# Patient Record
Sex: Male | Born: 1960
Health system: Southern US, Community
[De-identification: ages and names within clinical notes are randomized; demographics above are authoritative.]

## PROBLEM LIST (undated history)

## (undated) DIAGNOSIS — E785 Hyperlipidemia, unspecified: Secondary | ICD-10-CM

## (undated) DIAGNOSIS — J45909 Unspecified asthma, uncomplicated: Secondary | ICD-10-CM

## (undated) DIAGNOSIS — N529 Male erectile dysfunction, unspecified: Secondary | ICD-10-CM

## (undated) HISTORY — DX: Unspecified asthma, uncomplicated: J45.909

## (undated) HISTORY — DX: Hyperlipidemia, unspecified: E78.5

## (undated) HISTORY — DX: Male erectile dysfunction, unspecified: N52.9

---
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
## 2016-06-12 DIAGNOSIS — Z111 Encounter for screening for respiratory tuberculosis: Secondary | ICD-10-CM | POA: Diagnosis not present

## 2016-07-02 DIAGNOSIS — E785 Hyperlipidemia, unspecified: Secondary | ICD-10-CM | POA: Diagnosis not present

## 2016-07-02 DIAGNOSIS — Z0000 Encounter for general adult medical examination without abnormal findings: Secondary | ICD-10-CM | POA: Diagnosis not present

## 2016-07-02 DIAGNOSIS — Z125 Encounter for screening for malignant neoplasm of prostate: Secondary | ICD-10-CM | POA: Diagnosis not present

## 2016-07-02 DIAGNOSIS — Z131 Encounter for screening for diabetes mellitus: Secondary | ICD-10-CM | POA: Diagnosis not present

## 2016-11-14 DIAGNOSIS — J069 Acute upper respiratory infection, unspecified: Secondary | ICD-10-CM | POA: Diagnosis not present

## 2016-11-30 DIAGNOSIS — R05 Cough: Secondary | ICD-10-CM | POA: Diagnosis not present

## 2016-12-05 ENCOUNTER — Ambulatory Visit
Admission: RE | Admit: 2016-12-05 | Discharge: 2016-12-05 | Disposition: A | Discharge status: Home / Self Care | Payer: BLUE CROSS/BLUE SHIELD | Source: Ambulatory Visit | Attending: Family Medicine | Admitting: Family Medicine

## 2016-12-05 ENCOUNTER — Other Ambulatory Visit: Payer: Self-pay | Admitting: Family Medicine

## 2016-12-05 DIAGNOSIS — R059 Cough, unspecified: Secondary | ICD-10-CM

## 2016-12-05 DIAGNOSIS — R05 Cough: Secondary | ICD-10-CM | POA: Diagnosis not present

## 2016-12-05 IMAGING — CR DG CHEST 2V
2 series · 2 of 2 positions shown · non-contrast
Comparison: None.

CLINICAL DATA: Cough

EXAM:
CHEST  2 VIEW

[w chest pa]
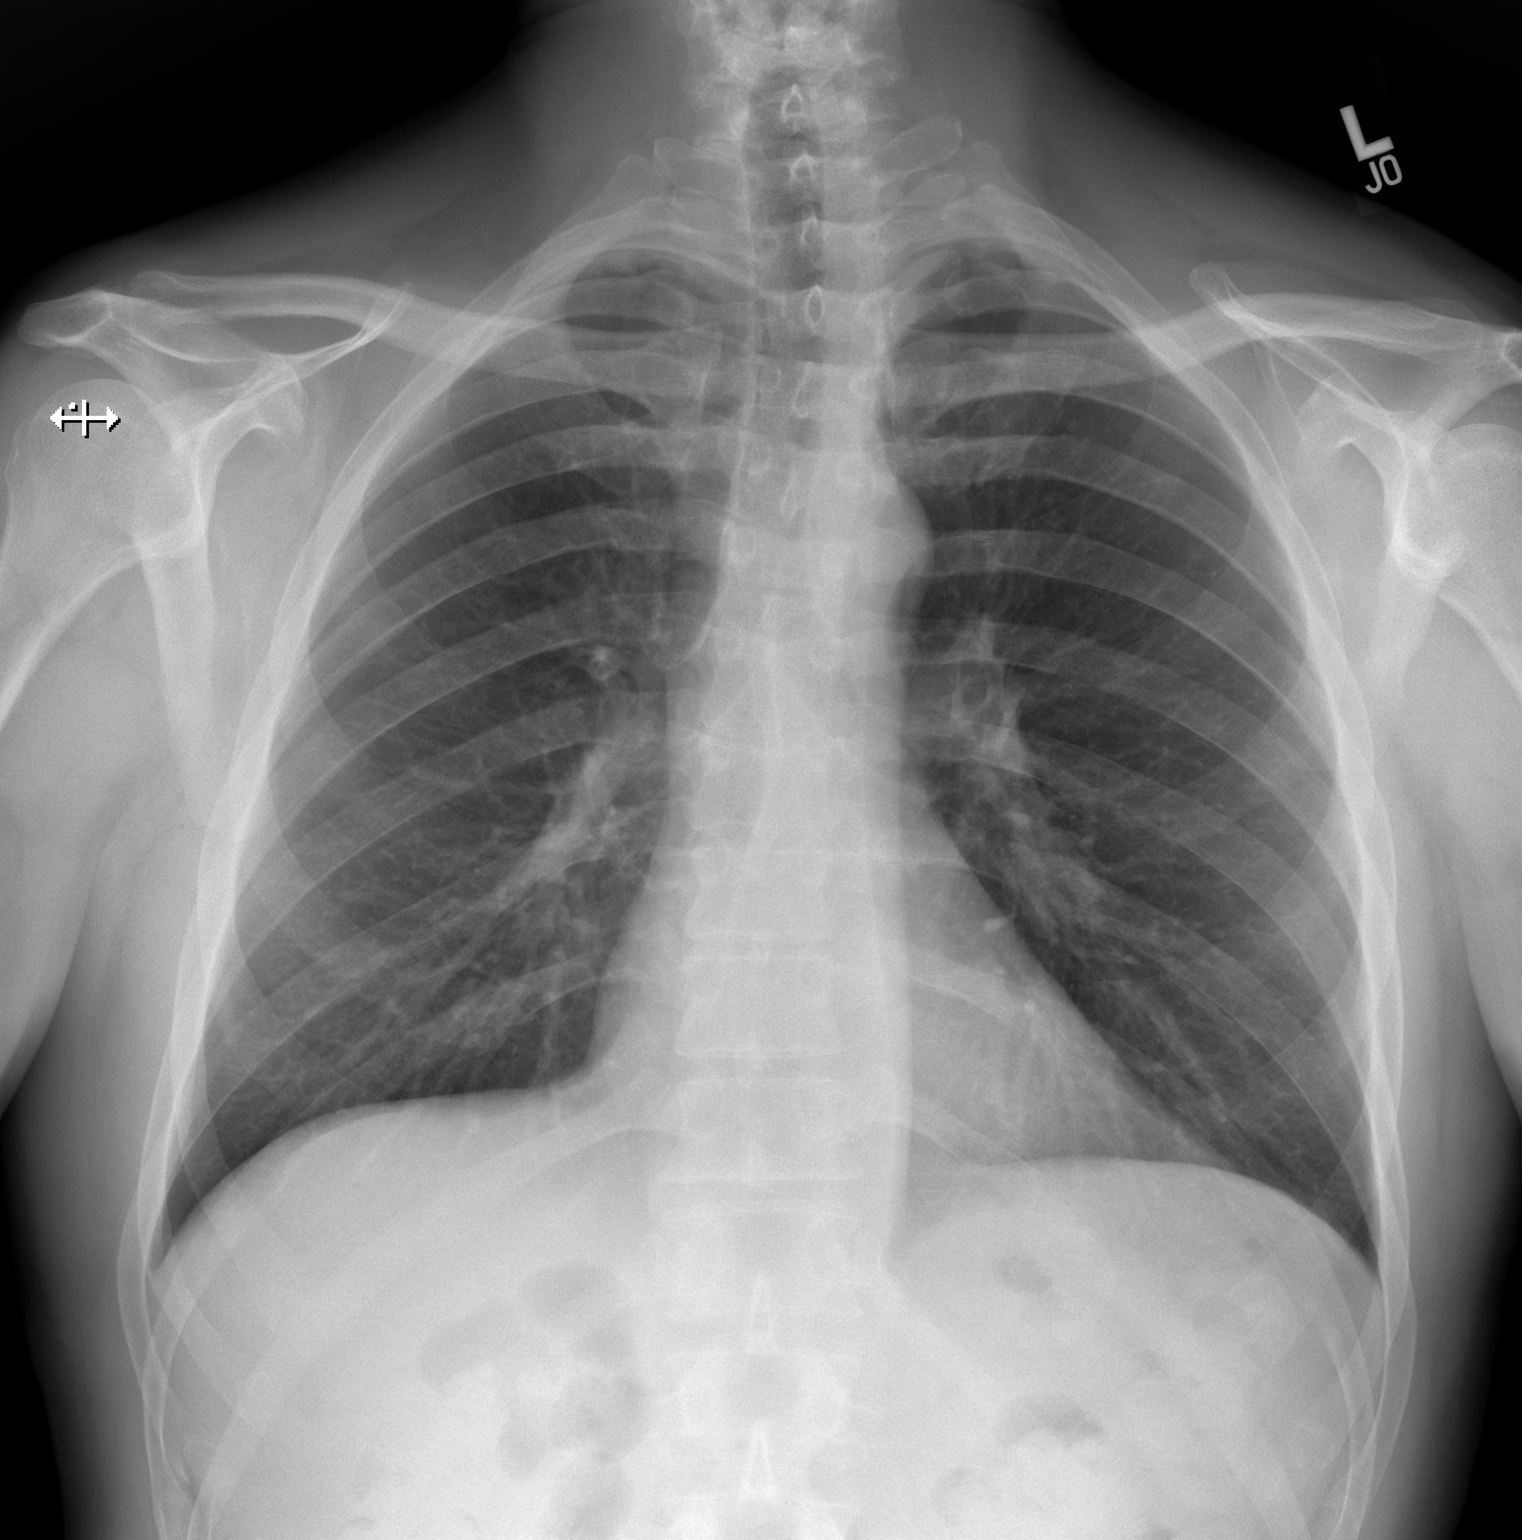

[w chest lat]
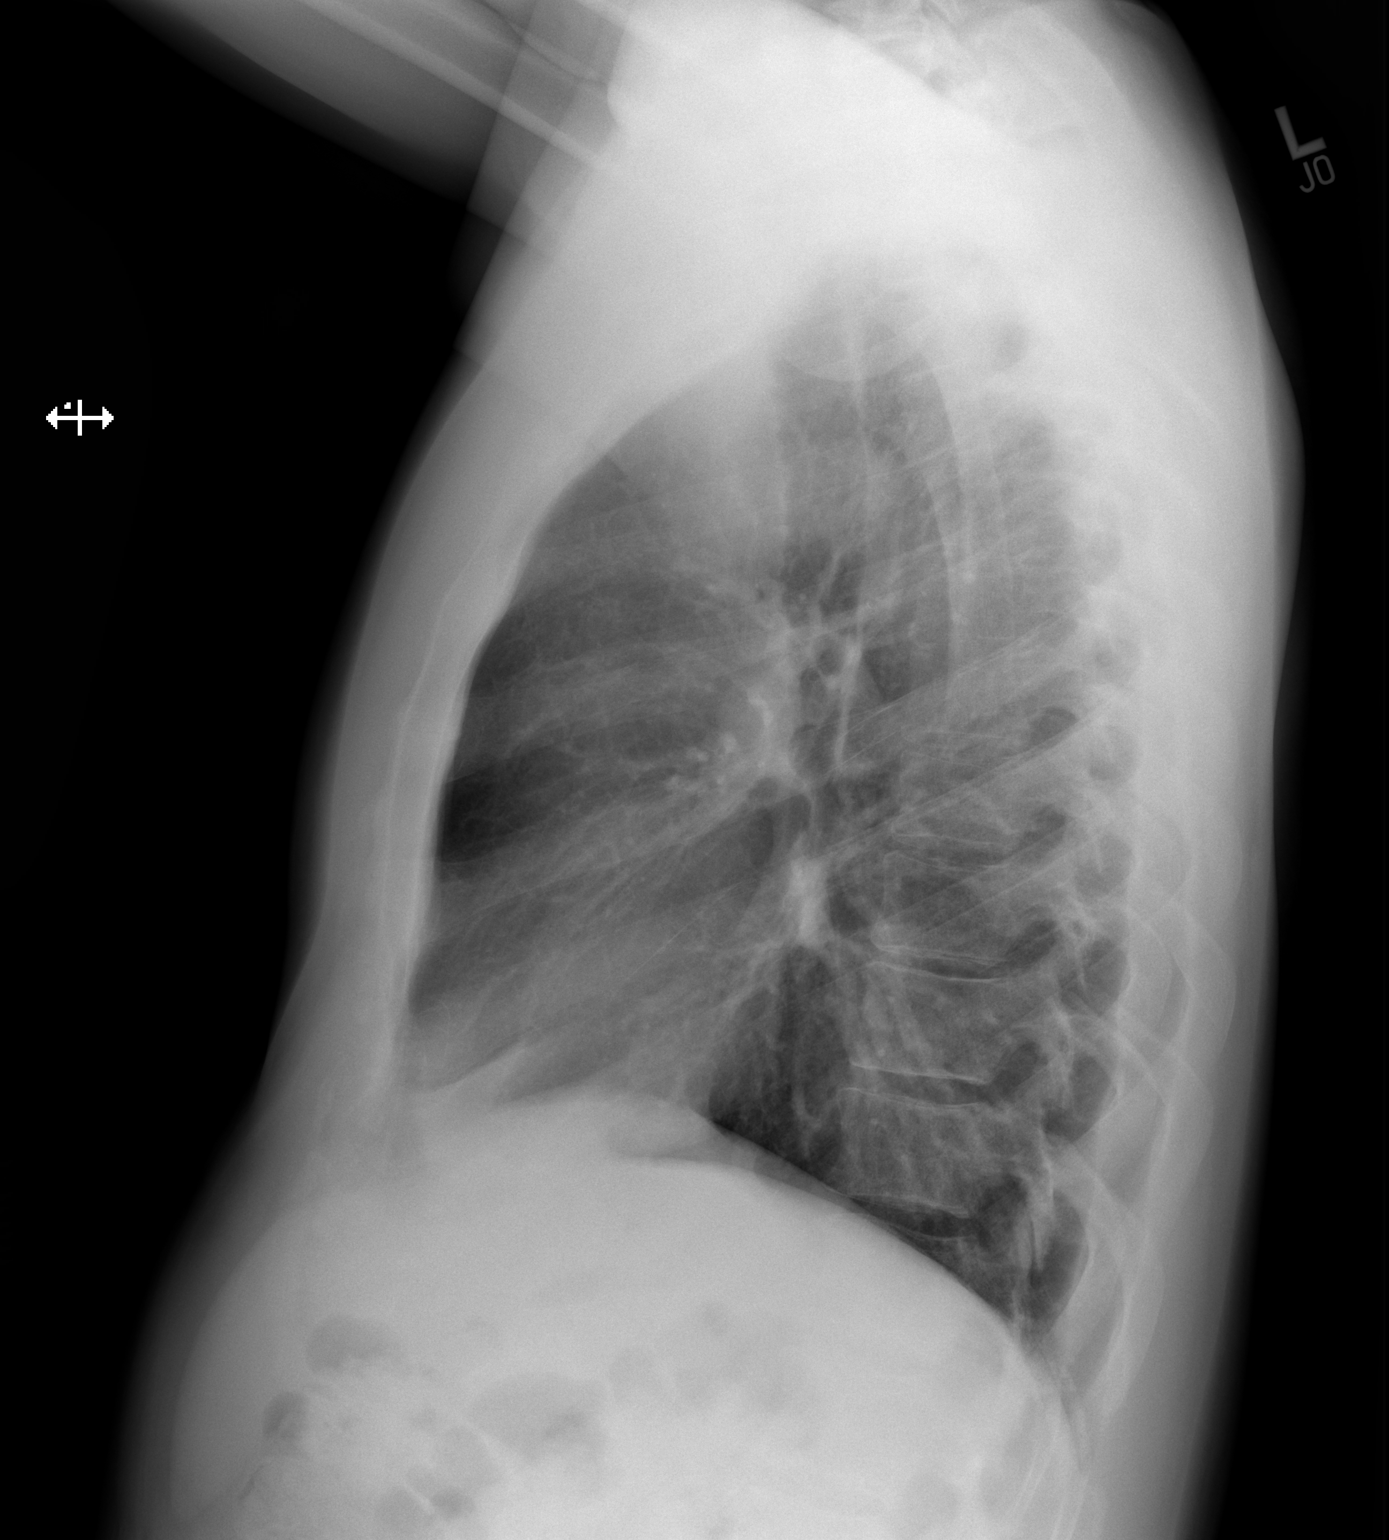

[2 of 2 positions shown; findings below may reference images not displayed]

FINDINGS: Heart and mediastinal contours are within normal limits. No focal
opacities or effusions. No acute bony abnormality.
IMPRESSION: No active cardiopulmonary disease.

## 2017-07-05 DIAGNOSIS — Z113 Encounter for screening for infections with a predominantly sexual mode of transmission: Secondary | ICD-10-CM | POA: Diagnosis not present

## 2017-07-05 DIAGNOSIS — E785 Hyperlipidemia, unspecified: Secondary | ICD-10-CM | POA: Diagnosis not present

## 2017-07-05 DIAGNOSIS — Z0000 Encounter for general adult medical examination without abnormal findings: Secondary | ICD-10-CM | POA: Diagnosis not present

## 2017-07-05 DIAGNOSIS — Z125 Encounter for screening for malignant neoplasm of prostate: Secondary | ICD-10-CM | POA: Diagnosis not present

## 2017-08-06 DIAGNOSIS — Z111 Encounter for screening for respiratory tuberculosis: Secondary | ICD-10-CM | POA: Diagnosis not present

## 2018-08-06 DIAGNOSIS — N529 Male erectile dysfunction, unspecified: Secondary | ICD-10-CM | POA: Diagnosis not present

## 2018-08-06 DIAGNOSIS — Z1211 Encounter for screening for malignant neoplasm of colon: Secondary | ICD-10-CM | POA: Diagnosis not present

## 2018-08-06 DIAGNOSIS — R51 Headache: Secondary | ICD-10-CM | POA: Diagnosis not present

## 2018-08-06 DIAGNOSIS — E785 Hyperlipidemia, unspecified: Secondary | ICD-10-CM | POA: Diagnosis not present

## 2018-08-12 ENCOUNTER — Other Ambulatory Visit: Payer: Self-pay | Admitting: Family Medicine

## 2018-08-12 DIAGNOSIS — G4484 Primary exertional headache: Secondary | ICD-10-CM

## 2018-08-18 ENCOUNTER — Other Ambulatory Visit: Payer: BLUE CROSS/BLUE SHIELD

## 2018-09-12 DIAGNOSIS — R159 Full incontinence of feces: Secondary | ICD-10-CM | POA: Diagnosis not present

## 2018-12-12 DIAGNOSIS — Z0000 Encounter for general adult medical examination without abnormal findings: Secondary | ICD-10-CM | POA: Diagnosis not present

## 2018-12-19 DIAGNOSIS — Z125 Encounter for screening for malignant neoplasm of prostate: Secondary | ICD-10-CM | POA: Diagnosis not present

## 2018-12-19 DIAGNOSIS — Z23 Encounter for immunization: Secondary | ICD-10-CM | POA: Diagnosis not present

## 2018-12-19 DIAGNOSIS — E785 Hyperlipidemia, unspecified: Secondary | ICD-10-CM | POA: Diagnosis not present

## 2019-01-02 DIAGNOSIS — Z1211 Encounter for screening for malignant neoplasm of colon: Secondary | ICD-10-CM | POA: Diagnosis not present

## 2019-01-22 DIAGNOSIS — Z20828 Contact with and (suspected) exposure to other viral communicable diseases: Secondary | ICD-10-CM | POA: Diagnosis not present

## 2019-02-02 DIAGNOSIS — F4322 Adjustment disorder with anxiety: Secondary | ICD-10-CM | POA: Diagnosis not present

## 2019-02-16 DIAGNOSIS — F4322 Adjustment disorder with anxiety: Secondary | ICD-10-CM | POA: Diagnosis not present

## 2019-03-02 DIAGNOSIS — F4322 Adjustment disorder with anxiety: Secondary | ICD-10-CM | POA: Diagnosis not present

## 2019-03-11 DIAGNOSIS — Z20828 Contact with and (suspected) exposure to other viral communicable diseases: Secondary | ICD-10-CM | POA: Diagnosis not present

## 2019-03-16 DIAGNOSIS — F4322 Adjustment disorder with anxiety: Secondary | ICD-10-CM | POA: Diagnosis not present

## 2019-04-08 DIAGNOSIS — F4322 Adjustment disorder with anxiety: Secondary | ICD-10-CM | POA: Diagnosis not present

## 2019-04-20 DIAGNOSIS — F4322 Adjustment disorder with anxiety: Secondary | ICD-10-CM | POA: Diagnosis not present

## 2019-05-04 DIAGNOSIS — F4322 Adjustment disorder with anxiety: Secondary | ICD-10-CM | POA: Diagnosis not present

## 2019-05-07 DIAGNOSIS — Z20828 Contact with and (suspected) exposure to other viral communicable diseases: Secondary | ICD-10-CM | POA: Diagnosis not present

## 2019-05-20 DIAGNOSIS — Z20828 Contact with and (suspected) exposure to other viral communicable diseases: Secondary | ICD-10-CM | POA: Diagnosis not present

## 2019-06-12 ENCOUNTER — Ambulatory Visit: Payer: BLUE CROSS/BLUE SHIELD | Attending: Internal Medicine

## 2019-06-19 ENCOUNTER — Ambulatory Visit: Payer: BLUE CROSS/BLUE SHIELD | Attending: Internal Medicine

## 2019-06-19 ENCOUNTER — Other Ambulatory Visit: Payer: Self-pay

## 2019-06-19 DIAGNOSIS — Z23 Encounter for immunization: Secondary | ICD-10-CM | POA: Insufficient documentation

## 2019-06-19 NOTE — Progress Notes (Signed)
   Covid-19 Vaccination Clinic  Name:  Olden Klauer    MRN: [id number] DOB: 1960/05/03  06/19/2019  Mr. Corp was observed post Covid-19 immunization for 15 minutes without incident. He was provided with Vaccine Information Sheet and instruction to access the V-Safe system.   Mr. Koenigs was instructed to call 911 with any severe reactions post vaccine: Marland Kitchen Difficulty breathing  . Swelling of face and throat  . A fast heartbeat  . A bad rash all over body  . Dizziness and weakness   Immunizations Administered    Name Date Dose VIS Date Route   Pfizer COVID-19 Vaccine 06/19/2019  3:29 PM 0.3 mL 03/27/2019 Intramuscular   Manufacturer: [employer], [employer]   Lot: [id number]   NDC: [address]-2

## 2019-07-20 ENCOUNTER — Ambulatory Visit: Payer: Self-pay

## 2019-07-22 ENCOUNTER — Ambulatory Visit: Payer: Self-pay | Attending: Internal Medicine

## 2019-07-22 DIAGNOSIS — Z23 Encounter for immunization: Secondary | ICD-10-CM

## 2019-07-22 NOTE — Progress Notes (Signed)
   Covid-19 Vaccination Clinic  Name:  Allen Burns    MRN: [id number] DOB: 10-26-1960  07/22/2019  Allen Burns was observed post Covid-19 immunization for 15 minutes without incident. He was provided with Vaccine Information Sheet and instruction to access the V-Safe system.   Allen Burns was instructed to call 911 with any severe reactions post vaccine: Marland Kitchen Difficulty breathing  . Swelling of face and throat  . A fast heartbeat  . A bad rash all over body  . Dizziness and weakness   Immunizations Administered    Name Date Dose VIS Date Route   Pfizer COVID-19 Vaccine 07/22/2019  1:05 PM 0.3 mL 03/27/2019 Intramuscular   Manufacturer: [employer], [employer]   Lot: [id number]   NDC: [address]-1

## 2019-08-14 DIAGNOSIS — Z23 Encounter for immunization: Secondary | ICD-10-CM | POA: Diagnosis not present

## 2019-08-18 DIAGNOSIS — E785 Hyperlipidemia, unspecified: Secondary | ICD-10-CM | POA: Diagnosis not present

## 2019-08-18 DIAGNOSIS — Z0000 Encounter for general adult medical examination without abnormal findings: Secondary | ICD-10-CM | POA: Diagnosis not present

## 2019-08-18 DIAGNOSIS — Z125 Encounter for screening for malignant neoplasm of prostate: Secondary | ICD-10-CM | POA: Diagnosis not present

## 2019-08-18 DIAGNOSIS — Z13220 Encounter for screening for lipoid disorders: Secondary | ICD-10-CM | POA: Diagnosis not present

## 2019-08-28 ENCOUNTER — Ambulatory Visit: Payer: Self-pay | Admitting: Sports Medicine

## 2019-09-07 DIAGNOSIS — F4322 Adjustment disorder with anxiety: Secondary | ICD-10-CM | POA: Diagnosis not present

## 2019-09-16 DIAGNOSIS — F4322 Adjustment disorder with anxiety: Secondary | ICD-10-CM | POA: Diagnosis not present

## 2019-09-21 DIAGNOSIS — M5412 Radiculopathy, cervical region: Secondary | ICD-10-CM | POA: Diagnosis not present

## 2019-09-24 DIAGNOSIS — M5412 Radiculopathy, cervical region: Secondary | ICD-10-CM | POA: Diagnosis not present

## 2019-09-29 DIAGNOSIS — M5412 Radiculopathy, cervical region: Secondary | ICD-10-CM | POA: Diagnosis not present

## 2019-10-01 DIAGNOSIS — M5412 Radiculopathy, cervical region: Secondary | ICD-10-CM | POA: Diagnosis not present

## 2019-10-06 DIAGNOSIS — M5412 Radiculopathy, cervical region: Secondary | ICD-10-CM | POA: Diagnosis not present

## 2019-10-09 DIAGNOSIS — M5412 Radiculopathy, cervical region: Secondary | ICD-10-CM | POA: Diagnosis not present

## 2019-10-14 DIAGNOSIS — F4322 Adjustment disorder with anxiety: Secondary | ICD-10-CM | POA: Diagnosis not present

## 2019-10-16 DIAGNOSIS — S86111D Strain of other muscle(s) and tendon(s) of posterior muscle group at lower leg level, right leg, subsequent encounter: Secondary | ICD-10-CM | POA: Diagnosis not present

## 2019-10-26 DIAGNOSIS — S86111D Strain of other muscle(s) and tendon(s) of posterior muscle group at lower leg level, right leg, subsequent encounter: Secondary | ICD-10-CM | POA: Diagnosis not present

## 2019-10-28 DIAGNOSIS — F4322 Adjustment disorder with anxiety: Secondary | ICD-10-CM | POA: Diagnosis not present

## 2019-10-30 DIAGNOSIS — S86111D Strain of other muscle(s) and tendon(s) of posterior muscle group at lower leg level, right leg, subsequent encounter: Secondary | ICD-10-CM | POA: Diagnosis not present

## 2019-11-16 DIAGNOSIS — S86111D Strain of other muscle(s) and tendon(s) of posterior muscle group at lower leg level, right leg, subsequent encounter: Secondary | ICD-10-CM | POA: Diagnosis not present

## 2019-11-18 DIAGNOSIS — S86111D Strain of other muscle(s) and tendon(s) of posterior muscle group at lower leg level, right leg, subsequent encounter: Secondary | ICD-10-CM | POA: Diagnosis not present

## 2019-11-23 DIAGNOSIS — S86102D Unspecified injury of other muscle(s) and tendon(s) of posterior muscle group at lower leg level, left leg, subsequent encounter: Secondary | ICD-10-CM | POA: Diagnosis not present

## 2019-11-23 DIAGNOSIS — S86111D Strain of other muscle(s) and tendon(s) of posterior muscle group at lower leg level, right leg, subsequent encounter: Secondary | ICD-10-CM | POA: Diagnosis not present

## 2019-11-25 DIAGNOSIS — S86111D Strain of other muscle(s) and tendon(s) of posterior muscle group at lower leg level, right leg, subsequent encounter: Secondary | ICD-10-CM | POA: Diagnosis not present

## 2019-11-25 DIAGNOSIS — F4322 Adjustment disorder with anxiety: Secondary | ICD-10-CM | POA: Diagnosis not present

## 2019-11-25 DIAGNOSIS — S86102D Unspecified injury of other muscle(s) and tendon(s) of posterior muscle group at lower leg level, left leg, subsequent encounter: Secondary | ICD-10-CM | POA: Diagnosis not present

## 2019-11-26 LAB — HM HEPATITIS C SCREENING LAB: HM Hepatitis Screen: NEGATIVE

## 2019-11-27 DIAGNOSIS — K635 Polyp of colon: Secondary | ICD-10-CM | POA: Diagnosis not present

## 2019-11-27 DIAGNOSIS — Z800 Family history of malignant neoplasm of digestive organs: Secondary | ICD-10-CM | POA: Diagnosis not present

## 2019-11-27 DIAGNOSIS — Z1211 Encounter for screening for malignant neoplasm of colon: Secondary | ICD-10-CM | POA: Diagnosis not present

## 2019-11-27 DIAGNOSIS — K6389 Other specified diseases of intestine: Secondary | ICD-10-CM | POA: Diagnosis not present

## 2019-11-27 LAB — HM COLONOSCOPY

## 2019-11-30 DIAGNOSIS — S86111D Strain of other muscle(s) and tendon(s) of posterior muscle group at lower leg level, right leg, subsequent encounter: Secondary | ICD-10-CM | POA: Diagnosis not present

## 2019-11-30 DIAGNOSIS — S86102D Unspecified injury of other muscle(s) and tendon(s) of posterior muscle group at lower leg level, left leg, subsequent encounter: Secondary | ICD-10-CM | POA: Diagnosis not present

## 2019-12-02 DIAGNOSIS — S86102D Unspecified injury of other muscle(s) and tendon(s) of posterior muscle group at lower leg level, left leg, subsequent encounter: Secondary | ICD-10-CM | POA: Diagnosis not present

## 2019-12-02 DIAGNOSIS — S86111D Strain of other muscle(s) and tendon(s) of posterior muscle group at lower leg level, right leg, subsequent encounter: Secondary | ICD-10-CM | POA: Diagnosis not present

## 2019-12-07 ENCOUNTER — Other Ambulatory Visit: Payer: Self-pay | Admitting: Gastroenterology

## 2019-12-07 DIAGNOSIS — S86102D Unspecified injury of other muscle(s) and tendon(s) of posterior muscle group at lower leg level, left leg, subsequent encounter: Secondary | ICD-10-CM | POA: Diagnosis not present

## 2019-12-07 DIAGNOSIS — S86111D Strain of other muscle(s) and tendon(s) of posterior muscle group at lower leg level, right leg, subsequent encounter: Secondary | ICD-10-CM | POA: Diagnosis not present

## 2019-12-07 DIAGNOSIS — Z800 Family history of malignant neoplasm of digestive organs: Secondary | ICD-10-CM

## 2019-12-09 DIAGNOSIS — F4322 Adjustment disorder with anxiety: Secondary | ICD-10-CM | POA: Diagnosis not present

## 2019-12-14 DIAGNOSIS — S86102D Unspecified injury of other muscle(s) and tendon(s) of posterior muscle group at lower leg level, left leg, subsequent encounter: Secondary | ICD-10-CM | POA: Diagnosis not present

## 2019-12-14 DIAGNOSIS — S86111D Strain of other muscle(s) and tendon(s) of posterior muscle group at lower leg level, right leg, subsequent encounter: Secondary | ICD-10-CM | POA: Diagnosis not present

## 2019-12-16 ENCOUNTER — Other Ambulatory Visit: Payer: Self-pay | Admitting: Gastroenterology

## 2019-12-18 ENCOUNTER — Ambulatory Visit
Admission: RE | Admit: 2019-12-18 | Discharge: 2019-12-18 | Disposition: A | Discharge status: Home / Self Care | Payer: BC Managed Care – PPO | Source: Ambulatory Visit | Attending: Gastroenterology | Admitting: Gastroenterology

## 2019-12-18 DIAGNOSIS — Z800 Family history of malignant neoplasm of digestive organs: Secondary | ICD-10-CM

## 2019-12-18 DIAGNOSIS — K8020 Calculus of gallbladder without cholecystitis without obstruction: Secondary | ICD-10-CM | POA: Diagnosis not present

## 2019-12-18 DIAGNOSIS — Z86018 Personal history of other benign neoplasm: Secondary | ICD-10-CM | POA: Diagnosis not present

## 2019-12-18 DIAGNOSIS — I700 Atherosclerosis of aorta: Secondary | ICD-10-CM | POA: Diagnosis not present

## 2019-12-18 MED ORDER — IOPAMIDOL (ISOVUE-300) INJECTION 61%
100.0000 mL | Freq: Once | INTRAVENOUS | Status: AC | PRN
  Administered 2019-12-18: 100 mL via INTRAVENOUS

## 2019-12-23 DIAGNOSIS — F4322 Adjustment disorder with anxiety: Secondary | ICD-10-CM | POA: Diagnosis not present

## 2019-12-23 DIAGNOSIS — S86102D Unspecified injury of other muscle(s) and tendon(s) of posterior muscle group at lower leg level, left leg, subsequent encounter: Secondary | ICD-10-CM | POA: Diagnosis not present

## 2019-12-23 DIAGNOSIS — S86111D Strain of other muscle(s) and tendon(s) of posterior muscle group at lower leg level, right leg, subsequent encounter: Secondary | ICD-10-CM | POA: Diagnosis not present

## 2020-01-06 DIAGNOSIS — F4322 Adjustment disorder with anxiety: Secondary | ICD-10-CM | POA: Diagnosis not present

## 2020-01-18 ENCOUNTER — Ambulatory Visit (INDEPENDENT_AMBULATORY_CARE_PROVIDER_SITE_OTHER): Payer: BC Managed Care – PPO | Admitting: Cardiology

## 2020-01-18 ENCOUNTER — Other Ambulatory Visit: Payer: Self-pay

## 2020-01-18 ENCOUNTER — Encounter: Payer: Self-pay | Admitting: Cardiology

## 2020-01-18 VITALS — BP 104/70 | HR 70 | Ht 69.0 in | Wt 181.0 lb

## 2020-01-18 DIAGNOSIS — Z8249 Family history of ischemic heart disease and other diseases of the circulatory system: Secondary | ICD-10-CM | POA: Diagnosis not present

## 2020-01-18 DIAGNOSIS — N529 Male erectile dysfunction, unspecified: Secondary | ICD-10-CM | POA: Diagnosis not present

## 2020-01-18 DIAGNOSIS — Z7189 Other specified counseling: Secondary | ICD-10-CM

## 2020-01-18 DIAGNOSIS — R072 Precordial pain: Secondary | ICD-10-CM

## 2020-01-18 DIAGNOSIS — Z01812 Encounter for preprocedural laboratory examination: Secondary | ICD-10-CM

## 2020-01-18 DIAGNOSIS — E785 Hyperlipidemia, unspecified: Secondary | ICD-10-CM

## 2020-01-18 LAB — BASIC METABOLIC PANEL
BUN/Creatinine Ratio: 15 (ref 9–20)
BUN: 13 mg/dL (ref 6–24)
CO2: 27 mmol/L (ref 20–29)
Calcium: 9.2 mg/dL (ref 8.7–10.2)
Chloride: 102 mmol/L (ref 96–106)
Creatinine, Ser: 0.86 mg/dL (ref 0.76–1.27)
GFR calc Af Amer: 112 mL/min/{1.73_m2} (ref >59)
GFR calc non Af Amer: 97 mL/min/{1.73_m2} (ref >59)
Glucose: 79 mg/dL (ref 65–99)
Potassium: 4.8 mmol/L (ref 3.5–5.2)
Sodium: 140 mmol/L (ref 134–144)

## 2020-01-18 MED ORDER — METOPROLOL TARTRATE 25 MG PO TABS: ORAL_TABLET | ORAL | 0 refills | Status: DC

## 2020-01-18 NOTE — Progress Notes (Signed)
Cardiology Office Note:    Date:  01/18/2020   ID:  Allen Burns, DOB 1960-12-08, MRN [id number]  PCP:  London Pepper, MD  Cardiologist:  Buford Dresser, MD  Referring MD: London Pepper, MD   CC: new patient consultation for exertional chest discomfort  History of Present Illness:    Allen Burns is a 59 y.o. male with a hx of erectile dysfunction, former tobacco use, family history of heart disease who is seen as a new consult at the request of London Pepper, MD for the evaluation and management of exertional chest discomfort.  Note from Dr. Orland Mustard Dated 08/18/19 reviewed. Noted chest tightness with running. Lipids from 08/18/19 reviewed, noted as Tchol 157, TG 80, HDL 43, LDL 99  Today: trying to get back into exercise after having an injury. Notes sharp, nagging pain in the chest intermittently.  Chest pain: -Initial onset: several years, but had been rare. Has been more consistent since the beginning of 2021 -Quality: sharp, left sided, mild. Always in the same spot. Feels short of breath at the same time, no nausea/diaphoresis -Frequency: several times a week, not always when he starts running but notices more with exerting himself (running up a hill, etc) -Duration: a few minutes -Associated symptoms: shortness of breath -Aggravating/alleviating factors: worse with exertion, better with rest -Prior cardiac history: none -Prior workup: none -Prior treatment: none -Alcohol: none in 25 years -Tobacco: former, quit >10 years ago -Comorbidities: erectile dysfunction. Had asthma as a child, then returned in his 21s.  -Exercise level: running 3-4 miles, including up steep hill -Cardiac ROS: no PND, no orthopnea, no LE edema, no syncope -Family history: father had 3V CABG in his 81s. Doesn't know much else about father's side, mother's side is without known cardiovascular issues. Lost one sibling to colon cancer, other two with no known heart issues.  Stopped eating fast food,  drinking soda 06/2019. Lost weight, but just regained on his honeymoon.   Past Medical History:  Diagnosis Date  . Asthma   . Erectile dysfunction   . Hyperlipidemia     Past Surgical History:  Procedure Laterality Date  . UMBILICAL HERNIA REPAIR  1975    Current Medications: Current Outpatient Medications on File Prior to Visit  Medication Sig  . sildenafil (VIAGRA) 50 MG tablet Take 50 mg by mouth daily as needed.   No current facility-administered medications on file prior to visit.     Allergies:   Patient has no known allergies.   Social History   Tobacco Use  . Smoking status: Former Research scientist (life sciences)  . Smokeless tobacco: Never Used  Substance Use Topics  . Alcohol use: Not Currently  . Drug use: Not on file    Family History: family history includes Breast cancer in his father; CAD in his mother; Colon cancer in his sister; Lung cancer in his father.  ROS:   Please see the history of present illness.  Additional pertinent ROS: Constitutional: Negative for chills, fever, unintentional weight loss. Has chronic sweating at night, getting better. HENT: Negative for ear pain and hearing loss.   Eyes: Negative for loss of vision and eye pain.  Respiratory: Negative for cough, sputum, wheezing.   Cardiovascular: See HPI. Gastrointestinal: Negative for abdominal pain, melena, and hematochezia.  Genitourinary: Negative for dysuria and hematuria.  Musculoskeletal: Negative for falls and myalgias.  Skin: Negative for itching and rash.  Neurological: Negative for focal weakness, focal sensory changes and loss of consciousness.  Endo/Heme/Allergies: Does not bruise/bleed easily.  EKGs/Labs/Other Studies Reviewed:    The following studies were reviewed today: No prior cardiac studies.  EKG:  EKG is personally reviewed.  The ekg ordered today demonstrates NSR at 70 bpm  Recent Labs: No results found for requested labs within last 8760 hours.  Recent Lipid Panel No  results found for: CHOL, TRIG, HDL, CHOLHDL, VLDL, LDLCALC, LDLDIRECT  Physical Exam:    VS:  BP 104/70   Pulse 70   Ht $R'5\' 9"'KJ$  (1.753 m)   Wt 181 lb (82.1 kg)   BMI 26.73 kg/m     Wt Readings from Last 3 Encounters:  01/18/20 181 lb (82.1 kg)    GEN: Well nourished, well developed in no acute distress HEENT: Normal, moist mucous membranes NECK: No JVD CARDIAC: regular rhythm, normal S1 and S2, no rubs or gallops. No murmurs. VASCULAR: Radial and DP pulses 2+ bilaterally. No carotid bruits RESPIRATORY:  Clear to auscultation without rales, wheezing or rhonchi  ABDOMEN: Soft, non-tender, non-distended MUSCULOSKELETAL:  Ambulates independently SKIN: Warm and dry, no edema NEUROLOGIC:  Alert and oriented x 3. No focal neuro deficits noted. PSYCHIATRIC:  Normal affect    ASSESSMENT:    1. Precordial pain   2. Erectile dysfunction, unspecified erectile dysfunction type   3. Family history of heart disease   4. Cardiac risk counseling   5. Counseling on health promotion and disease prevention   6. Pre-procedure lab exam   7. Hyperlipidemia, unspecified hyperlipidemia type    PLAN:    Exertional chest pain: -discussed treadmill stress, nuclear stress/lexiscan, and CT coronary angiography. Discussed pros and cons of each, including but not limited to false positive/false negative risk, radiation risk, and risk of IV contrast dye. Based on shared decision making, decision was made to pursue CT coronary angiography. -will give one time dose of metoprolol 2 hours prior to scheduled test -counseled on need to get BMET prior to test -counseled on use of sublingual nitroglycerin and its importance to a good test. Has not taken sildenafil in a month, counseled that he cannot take this prior to test.   Erectile dysfunction: can be a marker of vascular disease, see above re: counseling  Hyperlipidemia: chronic per patient Per [facility], last labs 08/18/19 showed Tchol 157, HDL 43, LDL 99, TG  80 -on no current agents.  Family history of heart disease Cardiac risk counseling and prevention recommendations: -recommend heart healthy/Mediterranean diet, with whole grains, fruits, vegetable, fish, lean meats, nuts, and olive oil. Limit salt. -recommend moderate walking, 3-5 times/week for 30-50 minutes each session. Aim for at least 150 minutes.week. Goal should be pace of 3 miles/hours, or walking 1.5 miles in 30 minutes -recommend avoidance of tobacco products. Avoid excess alcohol. -ASCVD risk score: The ASCVD Risk score Mikey Bussing DC Jr., et al., 2013) failed to calculate for the following reasons:   Cannot find a previous HDL lab   Cannot find a previous total cholesterol lab    Plan for follow up: based on results of testing. If CT unremarkable, can follow up as needed  Buford Dresser, MD, PhD [place]  [facility]    Medication Adjustments/Labs and Tests Ordered: Current medicines are reviewed at length with the patient today.  Concerns regarding medicines are outlined above.  Orders Placed This Encounter  Procedures  . CT CORONARY MORPH W/CTA COR W/SCORE W/CA W/CM &/OR WO/CM  . CT CORONARY FRACTIONAL FLOW RESERVE DATA PREP  . CT CORONARY FRACTIONAL FLOW RESERVE FLUID ANALYSIS  . Basic metabolic panel  .  EKG 12-Lead   Meds ordered this encounter  Medications  . metoprolol tartrate (LOPRESSOR) 25 MG tablet    Sig: TAKE 1 TABLET 1 HR PRIOR TO CARDIAC PROCEDURE    Dispense:  1 tablet    Refill:  0    Patient Instructions  Medication Instructions:  Your Physician recommend you continue on your current medication as directed.    *If you need a refill on your cardiac medications before your next appointment, please call your pharmacy*   Lab Work: Your physician recommends that you return for lab work today (BMP).  If you have labs (blood work) drawn today and your tests are completely normal, you will receive your results only by: Marland Kitchen MyChart Message (if  you have MyChart) OR . A paper copy in the mail If you have any lab test that is abnormal or we need to change your treatment, we will call you to review the results.   Testing/Procedures: Cardiac CT Angiography (CTA), is a special type of CT scan that uses a computer to produce multi-dimensional views of major blood vessels throughout the body. In CT angiography, a contrast material is injected through an IV to help visualize the blood vessels [facility]   Follow-Up: At [facility], you and your health needs are our priority.  As part of our continuing mission to provide you with exceptional heart care, we have created designated Provider Care Teams.  These Care Teams include your primary Cardiologist (physician) and Advanced Practice Providers (APPs -  Physician Assistants and Nurse Practitioners) who all work together to provide you with the care you need, when you need it.  We recommend signing up for the patient portal called "MyChart".  Sign up information is provided on this After Visit Summary.  MyChart is used to connect with patients for Virtual Visits (Telemedicine).  Patients are able to view lab/test results, encounter notes, upcoming appointments, etc.  Non-urgent messages can be sent to your provider as well.   To learn more about what you can do with MyChart, go to [email or web address].    Your next appointment:   Based on test results  The format for your next appointment:   In Person  Provider:   Buford Dresser, MD  Your cardiac CT will be scheduled at one of the below locations:   [facility] [address] [place], [place] [address] [phone]   If scheduled at [facility], please arrive at the [facility] main entrance of [facility] 30 minutes prior to test start time. Proceed to the [facility] Radiology Department (first floor) to check-in and test prep.  If scheduled at [facility], please arrive 15 mins early for check-in and test prep.  Please follow these instructions carefully (unless otherwise directed):  Hold all erectile dysfunction medications at least 3 days (72 hrs) prior to test.  On the Night Before the Test: . Be sure to Drink plenty of water. . Do not consume any caffeinated/decaffeinated beverages or chocolate 12 hours prior to your test. . Do not take any antihistamines 12 hours prior to your test.   On the Day of the Test: . Drink plenty of water. Do not drink any water within one hour of the test. . Do not eat any food 4 hours prior to the test. . You may take your regular medications prior to the test.  . Take metoprolol (Lopressor) 25 mg two hours prior to test.  After the Test: . Drink plenty of water. . After receiving IV contrast, you may experience a mild flushed feeling. This is normal. . On occasion, you may experience a mild rash up to 24 hours after the test. This is not dangerous. If this occurs, you can take Benadryl 25 mg and increase your fluid intake. . If you experience trouble breathing, this can be serious. If it is severe call 911 IMMEDIATELY. If it is mild, please call our office. . If you take any of these medications: Glipizide/Metformin, Avandament, Glucavance, please do not take 48 hours after completing test unless otherwise instructed.   Once we have confirmed authorization from your insurance company, we will call you to set up a date and time for your test. Based on how quickly your insurance processes prior authorizations requests, please allow up to 4 weeks to be contacted for scheduling your Cardiac CT appointment. Be advised that routine Cardiac CT appointments could be scheduled as many as 8 weeks after your provider has ordered it.  For non-scheduling related questions, please contact the cardiac imaging nurse navigator should you have any questions/concerns: Marchia Bond, Cardiac Imaging Nurse  Navigator Burley Saver, Interim Cardiac Imaging Nurse [place] [facility] Direct Office Dial: [phone]   For scheduling needs, including cancellations and rescheduling, please call Vivien Rota at [phone], option 3.       Signed, Buford Dresser, MD PhD 01/18/2020    [facility]

## 2020-01-18 NOTE — Patient Instructions (Signed)
Medication Instructions:  Your Physician recommend you continue on your current medication as directed.    *If you need a refill on your cardiac medications before your next appointment, please call your pharmacy*   Lab Work: Your physician recommends that you return for lab work today (BMP).  If you have labs (blood work) drawn today and your tests are completely normal, you will receive your results only by: Marland Kitchen MyChart Message (if you have MyChart) OR . A paper copy in the mail If you have any lab test that is abnormal or we need to change your treatment, we will call you to review the results.   Testing/Procedures: Cardiac CT Angiography (CTA), is a special type of CT scan that uses a computer to produce multi-dimensional views of major blood vessels throughout the body. In CT angiography, a contrast material is injected through an IV to help visualize the blood vessels [facility]   Follow-Up: At [facility], you and your health needs are our priority.  As part of our continuing mission to provide you with exceptional heart care, we have created designated Provider Care Teams.  These Care Teams include your primary Cardiologist (physician) and Advanced Practice Providers (APPs -  Physician Assistants and Nurse Practitioners) who all work together to provide you with the care you need, when you need it.  We recommend signing up for the patient portal called "MyChart".  Sign up information is provided on this After Visit Summary.  MyChart is used to connect with patients for Virtual Visits (Telemedicine).  Patients are able to view lab/test results, encounter notes, upcoming appointments, etc.  Non-urgent messages can be sent to your provider as well.   To learn more about what you can do with MyChart, go to [email or web address].    Your next appointment:   Based on test results  The format for your next appointment:   In Person  Provider:   Jodelle Red,  MD  Your cardiac CT will be scheduled at one of the below locations:   [facility] [address] [place], [place] [address] [phone]   If scheduled at [facility], please arrive at the [facility] main entrance of [facility] 30 minutes prior to test start time. Proceed to the [facility] Radiology Department (first floor) to check-in and test prep.  If scheduled at [facility], please arrive 15 mins early for check-in and test prep.  Please follow these instructions carefully (unless otherwise directed):  Hold all erectile dysfunction medications at least 3 days (72 hrs) prior to test.  On the Night Before the Test: . Be sure to Drink plenty of water. . Do not consume any caffeinated/decaffeinated beverages or chocolate 12 hours prior to your test. . Do not take any antihistamines 12 hours prior to your test.   On the Day of the Test: . Drink plenty of water. Do not drink any water within one hour of the test. . Do not eat any food 4 hours prior to the test. . You may take your regular medications prior to the test.  . Take metoprolol (Lopressor) 25 mg two hours prior to test.         After the Test: . Drink plenty of water. . After receiving IV contrast, you may experience a mild flushed feeling. This is normal. . On occasion, you may experience a mild rash up to 24 hours after the test. This is not dangerous. If this occurs,  you can take Benadryl 25 mg and increase your fluid intake. . If you experience trouble breathing, this can be serious. If it is severe call 911 IMMEDIATELY. If it is mild, please call our office. . If you take any of these medications: Glipizide/Metformin, Avandament, Glucavance, please do not take 48 hours after completing test unless otherwise instructed.   Once we have confirmed authorization from your insurance company, we will call you to set up a date and time for your test. Based on  how quickly your insurance processes prior authorizations requests, please allow up to 4 weeks to be contacted for scheduling your Cardiac CT appointment. Be advised that routine Cardiac CT appointments could be scheduled as many as 8 weeks after your provider has ordered it.  For non-scheduling related questions, please contact the cardiac imaging nurse navigator should you have any questions/concerns: Marchia Bond, Cardiac Imaging Nurse Navigator Burley Saver, Interim Cardiac Imaging Nurse [place] [facility] Services Direct Office Dial: [phone]   For scheduling needs, including cancellations and rescheduling, please call Vivien Rota at [phone], option 3.

## 2020-01-19 ENCOUNTER — Other Ambulatory Visit: Payer: Self-pay | Admitting: Cardiology

## 2020-01-20 ENCOUNTER — Telehealth: Payer: Self-pay | Admitting: Cardiology

## 2020-01-20 DIAGNOSIS — F4322 Adjustment disorder with anxiety: Secondary | ICD-10-CM | POA: Diagnosis not present

## 2020-01-20 NOTE — Telephone Encounter (Signed)
Pt updated with lab results and verbalized understanding.  

## 2020-01-20 NOTE — Telephone Encounter (Signed)
Patient is returning call to discuss results from lab work completed on 01/18/20. 

## 2020-01-27 DIAGNOSIS — Z20822 Contact with and (suspected) exposure to covid-19: Secondary | ICD-10-CM | POA: Diagnosis not present

## 2020-01-31 ENCOUNTER — Encounter: Payer: Self-pay | Admitting: Cardiology

## 2020-01-31 DIAGNOSIS — E785 Hyperlipidemia, unspecified: Secondary | ICD-10-CM | POA: Insufficient documentation

## 2020-01-31 DIAGNOSIS — N529 Male erectile dysfunction, unspecified: Secondary | ICD-10-CM | POA: Insufficient documentation

## 2020-02-03 DIAGNOSIS — F4322 Adjustment disorder with anxiety: Secondary | ICD-10-CM | POA: Diagnosis not present

## 2020-02-05 ENCOUNTER — Telehealth (HOSPITAL_COMMUNITY): Payer: Self-pay | Admitting: Emergency Medicine

## 2020-02-05 NOTE — Telephone Encounter (Signed)
Pt returning phone call regarding upcoming cardiac imaging study; pt verbalizes understanding of appt date/time, parking situation and where to check in, pre-test NPO status and medications ordered, and verified current allergies; name and call back number provided for further questions should they arise Rockwell Alexandria RN Navigator Cardiac Imaging [facility] [phone] office [phone] cell  Pt reminded to pick up metoprolol from pharmacy (I called the pharm to reprepare the med for pick up)  Pt also verbalized understanding to avoid use of ED medications prior to test.  [place] Dec

## 2020-02-05 NOTE — Telephone Encounter (Signed)
Attempted to call patient regarding upcoming cardiac CT appointment. °Left message on voicemail with name and callback number °Sanora Cunanan RN Navigator Cardiac Imaging °[facility] °[phone] Office °[phone] Cell ° °

## 2020-02-06 ENCOUNTER — Other Ambulatory Visit: Payer: Self-pay | Admitting: Cardiology

## 2020-02-07 ENCOUNTER — Other Ambulatory Visit: Payer: Self-pay | Admitting: Cardiology

## 2020-02-08 ENCOUNTER — Other Ambulatory Visit: Payer: Self-pay

## 2020-02-08 ENCOUNTER — Other Ambulatory Visit: Payer: Self-pay | Admitting: Cardiology

## 2020-02-08 ENCOUNTER — Ambulatory Visit (HOSPITAL_COMMUNITY)
Admission: RE | Admit: 2020-02-08 | Discharge: 2020-02-08 | Disposition: A | Discharge status: Home / Self Care | Payer: BC Managed Care – PPO | Source: Ambulatory Visit | Attending: Cardiology | Admitting: Cardiology

## 2020-02-08 DIAGNOSIS — R072 Precordial pain: Secondary | ICD-10-CM | POA: Insufficient documentation

## 2020-02-08 DIAGNOSIS — I2510 Atherosclerotic heart disease of native coronary artery without angina pectoris: Secondary | ICD-10-CM | POA: Diagnosis not present

## 2020-02-08 MED ORDER — NITROGLYCERIN 0.4 MG SL SUBL
0.8000 mg | SUBLINGUAL_TABLET | Freq: Once | SUBLINGUAL | Status: AC
  Administered 2020-02-08: 0.8 mg via SUBLINGUAL

## 2020-02-08 MED ORDER — IOHEXOL 350 MG/ML SOLN
80.0000 mL | Freq: Once | INTRAVENOUS | Status: AC | PRN
  Administered 2020-02-08: 80 mL via INTRAVENOUS

## 2020-02-08 MED ORDER — NITROGLYCERIN 0.4 MG SL SUBL
SUBLINGUAL_TABLET | SUBLINGUAL | Status: AC
  Filled 2020-02-08: qty 2

## 2020-02-08 NOTE — Progress Notes (Signed)
CT scan completed. Tolerated well. Blood pressure slightly low, pt asymptomatic, states he feels good. D/C home ambulatory, awake and alert. In no distress

## 2020-02-10 ENCOUNTER — Ambulatory Visit (HOSPITAL_COMMUNITY)
Admission: RE | Admit: 2020-02-10 | Discharge: 2020-02-10 | Disposition: A | Discharge status: Home / Self Care | Payer: BC Managed Care – PPO | Source: Ambulatory Visit | Attending: Cardiology | Admitting: Cardiology

## 2020-02-10 DIAGNOSIS — R072 Precordial pain: Secondary | ICD-10-CM

## 2020-02-11 DIAGNOSIS — I2510 Atherosclerotic heart disease of native coronary artery without angina pectoris: Secondary | ICD-10-CM | POA: Diagnosis not present

## 2020-02-22 ENCOUNTER — Other Ambulatory Visit: Payer: Self-pay

## 2020-02-22 ENCOUNTER — Emergency Department (HOSPITAL_COMMUNITY): Payer: BC Managed Care – PPO

## 2020-02-22 ENCOUNTER — Encounter (HOSPITAL_COMMUNITY): Payer: Self-pay | Admitting: Emergency Medicine

## 2020-02-22 ENCOUNTER — Observation Stay (HOSPITAL_COMMUNITY)
Admission: EM | Admit: 2020-02-22 | Discharge: 2020-02-23 | Disposition: A | Discharge status: Home / Self Care | Payer: BC Managed Care – PPO | Attending: Cardiology | Admitting: Cardiology

## 2020-02-22 DIAGNOSIS — R072 Precordial pain: Secondary | ICD-10-CM | POA: Diagnosis not present

## 2020-02-22 DIAGNOSIS — R21 Rash and other nonspecific skin eruption: Secondary | ICD-10-CM | POA: Diagnosis not present

## 2020-02-22 DIAGNOSIS — E785 Hyperlipidemia, unspecified: Secondary | ICD-10-CM | POA: Diagnosis not present

## 2020-02-22 DIAGNOSIS — Z955 Presence of coronary angioplasty implant and graft: Secondary | ICD-10-CM

## 2020-02-22 DIAGNOSIS — Z20822 Contact with and (suspected) exposure to covid-19: Secondary | ICD-10-CM | POA: Insufficient documentation

## 2020-02-22 DIAGNOSIS — R079 Chest pain, unspecified: Secondary | ICD-10-CM | POA: Diagnosis present

## 2020-02-22 DIAGNOSIS — I200 Unstable angina: Secondary | ICD-10-CM | POA: Diagnosis not present

## 2020-02-22 DIAGNOSIS — Z87891 Personal history of nicotine dependence: Secondary | ICD-10-CM | POA: Diagnosis not present

## 2020-02-22 LAB — BASIC METABOLIC PANEL
Anion gap: 10 (ref 5–15)
BUN: 15 mg/dL (ref 6–20)
CO2: 25 mmol/L (ref 22–32)
Calcium: 9.2 mg/dL (ref 8.9–10.3)
Chloride: 101 mmol/L (ref 98–111)
Creatinine, Ser: 0.97 mg/dL (ref 0.61–1.24)
GFR, Estimated: >60 mL/min (ref >60)
Glucose, Bld: 120 mg/dL — ABNORMAL HIGH (ref 70–99)
Potassium: 4 mmol/L (ref 3.5–5.1)
Sodium: 136 mmol/L (ref 135–145)

## 2020-02-22 LAB — CBC
HCT: 49.7 % (ref 39.0–52.0)
Hemoglobin: 15.9 g/dL (ref 13.0–17.0)
MCH: 29.5 pg (ref 26.0–34.0)
MCHC: 32 g/dL (ref 30.0–36.0)
MCV: 92.2 fL (ref 80.0–100.0)
Platelets: 271 10*3/uL (ref 150–400)
RBC: 5.39 MIL/uL (ref 4.22–5.81)
RDW: 12.2 % (ref 11.5–15.5)
WBC: 8.7 10*3/uL (ref 4.0–10.5)
nRBC: 0 % (ref 0.0–0.2)

## 2020-02-22 LAB — TROPONIN I (HIGH SENSITIVITY)
Troponin I (High Sensitivity): 4 ng/L (ref <18)
Troponin I (High Sensitivity): 3 ng/L (ref <18)

## 2020-02-22 NOTE — ED Triage Notes (Signed)
Pt c/o cp for the past few days getting worse today. With nausea and vomiting.

## 2020-02-23 ENCOUNTER — Other Ambulatory Visit (HOSPITAL_COMMUNITY): Payer: Self-pay | Admitting: Cardiology

## 2020-02-23 ENCOUNTER — Encounter (HOSPITAL_COMMUNITY): Payer: Self-pay

## 2020-02-23 ENCOUNTER — Encounter (HOSPITAL_COMMUNITY)
Admission: EM | Disposition: A | Discharge status: Home / Self Care | Payer: Self-pay | Source: Home / Self Care | Attending: Emergency Medicine

## 2020-02-23 DIAGNOSIS — E7800 Pure hypercholesterolemia, unspecified: Secondary | ICD-10-CM | POA: Diagnosis not present

## 2020-02-23 DIAGNOSIS — R079 Chest pain, unspecified: Secondary | ICD-10-CM | POA: Diagnosis present

## 2020-02-23 DIAGNOSIS — I200 Unstable angina: Secondary | ICD-10-CM | POA: Diagnosis not present

## 2020-02-23 DIAGNOSIS — E785 Hyperlipidemia, unspecified: Secondary | ICD-10-CM | POA: Diagnosis not present

## 2020-02-23 DIAGNOSIS — Z20822 Contact with and (suspected) exposure to covid-19: Secondary | ICD-10-CM | POA: Diagnosis not present

## 2020-02-23 DIAGNOSIS — Z87891 Personal history of nicotine dependence: Secondary | ICD-10-CM | POA: Diagnosis not present

## 2020-02-23 DIAGNOSIS — I25110 Atherosclerotic heart disease of native coronary artery with unstable angina pectoris: Secondary | ICD-10-CM | POA: Diagnosis not present

## 2020-02-23 DIAGNOSIS — I25119 Atherosclerotic heart disease of native coronary artery with unspecified angina pectoris: Secondary | ICD-10-CM

## 2020-02-23 HISTORY — PX: CORONARY STENT INTERVENTION: CATH118234

## 2020-02-23 HISTORY — PX: INTRAVASCULAR ULTRASOUND/IVUS: CATH118244

## 2020-02-23 HISTORY — PX: LEFT HEART CATH AND CORONARY ANGIOGRAPHY: CATH118249

## 2020-02-23 LAB — RESPIRATORY PANEL BY RT PCR (FLU A&B, COVID)
Influenza A by PCR: NEGATIVE
Influenza B by PCR: NEGATIVE
SARS Coronavirus 2 by RT PCR: NEGATIVE

## 2020-02-23 LAB — POCT ACTIVATED CLOTTING TIME: Activated Clotting Time: 367 seconds

## 2020-02-23 SURGERY — LEFT HEART CATH AND CORONARY ANGIOGRAPHY
Anesthesia: LOCAL

## 2020-02-23 MED ORDER — HEPARIN (PORCINE) IN NACL 1000-0.9 UT/500ML-% IV SOLN
INTRAVENOUS | Status: DC | PRN
  Administered 2020-02-23 (×2): 500 mL

## 2020-02-23 MED ORDER — LIDOCAINE HCL (PF) 1 % IJ SOLN
INTRAMUSCULAR | Status: AC
  Filled 2020-02-23: qty 30

## 2020-02-23 MED ORDER — ATORVASTATIN CALCIUM 80 MG PO TABS: 80.0000 mg | ORAL_TABLET | Freq: Every day | ORAL | 0 refills | Status: DC

## 2020-02-23 MED ORDER — FENTANYL CITRATE (PF) 100 MCG/2ML IJ SOLN
INTRAMUSCULAR | Status: AC
  Filled 2020-02-23: qty 2

## 2020-02-23 MED ORDER — ATORVASTATIN CALCIUM 80 MG PO TABS: 80.0000 mg | ORAL_TABLET | Freq: Every day | ORAL | 3 refills | Status: DC

## 2020-02-23 MED ORDER — SODIUM CHLORIDE 0.9 % WEIGHT BASED INFUSION
1.0000 mL/kg/h | INTRAVENOUS | Status: DC
  Administered 2020-02-23: 1 mL/kg/h via INTRAVENOUS

## 2020-02-23 MED ORDER — ASPIRIN 81 MG PO TBEC
81.0000 mg | DELAYED_RELEASE_TABLET | Freq: Every day | ORAL | 3 refills | Status: DC
Start: 2020-02-24 — End: 2020-02-24

## 2020-02-23 MED ORDER — IOHEXOL 350 MG/ML SOLN
INTRAVENOUS | Status: DC | PRN
  Administered 2020-02-23: 110 mL

## 2020-02-23 MED ORDER — ASPIRIN EC 81 MG PO TBEC: 81.0000 mg | DELAYED_RELEASE_TABLET | Freq: Every day | ORAL | Status: DC

## 2020-02-23 MED ORDER — TICAGRELOR 90 MG PO TABS
ORAL_TABLET | ORAL | Status: DC | PRN
  Administered 2020-02-23: 180 mg via ORAL

## 2020-02-23 MED ORDER — METOPROLOL TARTRATE 25 MG PO TABS: 12.5000 mg | ORAL_TABLET | Freq: Two times a day (BID) | ORAL | 3 refills | Status: DC

## 2020-02-23 MED ORDER — METOPROLOL TARTRATE 25 MG PO TABS: 12.5000 mg | ORAL_TABLET | Freq: Two times a day (BID) | ORAL | 0 refills | Status: DC

## 2020-02-23 MED ORDER — HEPARIN (PORCINE) IN NACL 1000-0.9 UT/500ML-% IV SOLN
INTRAVENOUS | Status: AC
  Filled 2020-02-23: qty 1000

## 2020-02-23 MED ORDER — VERAPAMIL HCL 2.5 MG/ML IV SOLN
INTRAVENOUS | Status: AC
  Filled 2020-02-23: qty 2

## 2020-02-23 MED ORDER — NITROGLYCERIN 0.4 MG SL SUBL: 0.4000 mg | SUBLINGUAL_TABLET | SUBLINGUAL | 2 refills | Status: DC | PRN

## 2020-02-23 MED ORDER — SODIUM CHLORIDE 0.9 % WEIGHT BASED INFUSION: 1.0000 mL/kg/h | INTRAVENOUS | Status: DC

## 2020-02-23 MED ORDER — ACETAMINOPHEN 325 MG PO TABS: 650.0000 mg | ORAL_TABLET | ORAL | Status: DC | PRN

## 2020-02-23 MED ORDER — SODIUM CHLORIDE 0.9 % IV SOLN: 250.0000 mL | INTRAVENOUS | Status: DC | PRN

## 2020-02-23 MED ORDER — ONDANSETRON HCL 4 MG/2ML IJ SOLN: 4.0000 mg | Freq: Four times a day (QID) | INTRAMUSCULAR | Status: DC | PRN

## 2020-02-23 MED ORDER — NITROGLYCERIN 1 MG/10 ML FOR IR/CATH LAB
INTRA_ARTERIAL | Status: AC
  Filled 2020-02-23: qty 10

## 2020-02-23 MED ORDER — TICAGRELOR 90 MG PO TABS: 90.0000 mg | ORAL_TABLET | Freq: Two times a day (BID) | ORAL | 0 refills | Status: DC

## 2020-02-23 MED ORDER — TICAGRELOR 90 MG PO TABS: 90.0000 mg | ORAL_TABLET | Freq: Two times a day (BID) | ORAL | Status: DC

## 2020-02-23 MED ORDER — HEPARIN SODIUM (PORCINE) 1000 UNIT/ML IJ SOLN
INTRAMUSCULAR | Status: AC
  Filled 2020-02-23: qty 1

## 2020-02-23 MED ORDER — SODIUM CHLORIDE 0.9 % WEIGHT BASED INFUSION: 3.0000 mL/kg/h | INTRAVENOUS | Status: AC

## 2020-02-23 MED ORDER — ASPIRIN 81 MG PO TBEC
81.0000 mg | DELAYED_RELEASE_TABLET | Freq: Every day | ORAL | 0 refills | Status: DC
Start: 2020-02-24 — End: 2020-02-23

## 2020-02-23 MED ORDER — TICAGRELOR 90 MG PO TABS
ORAL_TABLET | ORAL | Status: AC
  Filled 2020-02-23: qty 2

## 2020-02-23 MED ORDER — NITROGLYCERIN 1 MG/10 ML FOR IR/CATH LAB
INTRA_ARTERIAL | Status: DC | PRN
  Administered 2020-02-23: 200 ug via INTRACORONARY

## 2020-02-23 MED ORDER — ASPIRIN 81 MG PO CHEW
324.0000 mg | CHEWABLE_TABLET | Freq: Once | ORAL | Status: AC
  Administered 2020-02-23: 324 mg via ORAL
  Filled 2020-02-23: qty 4

## 2020-02-23 MED ORDER — SODIUM CHLORIDE 0.9% FLUSH: 3.0000 mL | INTRAVENOUS | Status: DC | PRN

## 2020-02-23 MED ORDER — NITROGLYCERIN 0.4 MG SL SUBL: 0.4000 mg | SUBLINGUAL_TABLET | SUBLINGUAL | 0 refills | Status: DC | PRN

## 2020-02-23 MED ORDER — TICAGRELOR 90 MG PO TABS
90.0000 mg | ORAL_TABLET | Freq: Two times a day (BID) | ORAL | 1 refills | Status: DC
Start: 2020-02-23 — End: 2020-06-02

## 2020-02-23 MED ORDER — HEPARIN SODIUM (PORCINE) 1000 UNIT/ML IJ SOLN
INTRAMUSCULAR | Status: DC | PRN
  Administered 2020-02-23: 4500 [IU] via INTRAVENOUS
  Administered 2020-02-23: 4000 [IU] via INTRAVENOUS

## 2020-02-23 MED ORDER — FENTANYL CITRATE (PF) 100 MCG/2ML IJ SOLN
INTRAMUSCULAR | Status: DC | PRN
  Administered 2020-02-23: 25 ug via INTRAVENOUS

## 2020-02-23 MED ORDER — SODIUM CHLORIDE 0.9% FLUSH: 3.0000 mL | Freq: Two times a day (BID) | INTRAVENOUS | Status: DC

## 2020-02-23 MED ORDER — LIDOCAINE HCL (PF) 1 % IJ SOLN
INTRAMUSCULAR | Status: DC | PRN
  Administered 2020-02-23: 2 mL

## 2020-02-23 MED ORDER — VERAPAMIL HCL 2.5 MG/ML IV SOLN
INTRAVENOUS | Status: DC | PRN
  Administered 2020-02-23: 10 mL via INTRA_ARTERIAL

## 2020-02-23 MED ORDER — MIDAZOLAM HCL 2 MG/2ML IJ SOLN
INTRAMUSCULAR | Status: DC | PRN
  Administered 2020-02-23: 1 mg via INTRAVENOUS

## 2020-02-23 MED ORDER — MIDAZOLAM HCL 2 MG/2ML IJ SOLN
INTRAMUSCULAR | Status: AC
  Filled 2020-02-23: qty 2

## 2020-02-23 MED FILL — ATORVASTATIN CALCIUM 80 MG: 80 | 30 days supply | Qty: 30 | Fill #0

## 2020-02-23 MED FILL — METOPROLOL TARTRATE 25 MG T: 25 | 30 days supply | Qty: 30 | Fill #0

## 2020-02-23 MED FILL — BRILINTA 90 MG TABLET: 90 | 30 days supply | Qty: 60 | Fill #0

## 2020-02-23 MED FILL — NITROGLYCERIN 0.4 MG TAB SL: 0.4 | 7 days supply | Qty: 25 | Fill #0

## 2020-02-23 MED FILL — ASPIRIN LOW DOSE 81 MG TBEC: 81 | 30 days supply | Qty: 30 | Fill #0

## 2020-02-23 SURGICAL SUPPLY — 18 items
BALLN SAPPHIRE 2.5X12 (BALLOONS) ×2
BALLN SAPPHIRE ~~LOC~~ 3.25X10 (BALLOONS) ×2 IMPLANT
BALLOON SAPPHIRE 2.5X12 (BALLOONS) ×1 IMPLANT
CATH 5FR JL3.5 JR4 ANG PIG MP (CATHETERS) ×2 IMPLANT
CATH LAUNCHER 6FR EBU3.5 (CATHETERS) ×2 IMPLANT
CATH OPTICROSS HD (CATHETERS) ×2 IMPLANT
DEVICE RAD COMP TR BAND LRG (VASCULAR PRODUCTS) ×2 IMPLANT
GLIDESHEATH SLEND SS 6F .021 (SHEATH) ×2 IMPLANT
GUIDEWIRE INQWIRE 1.5J.035X260 (WIRE) ×1 IMPLANT
INQWIRE 1.5J .035X260CM (WIRE) ×2
KIT ENCORE 26 ADVANTAGE (KITS) ×2 IMPLANT
KIT HEART LEFT (KITS) ×2 IMPLANT
PACK CARDIAC CATHETERIZATION (CUSTOM PROCEDURE TRAY) ×2 IMPLANT
SLED PULL BACK IVUS (MISCELLANEOUS) ×2 IMPLANT
STENT RESOLUTE ONYX 3.0X18 (Permanent Stent) ×2 IMPLANT
TRANSDUCER W/STOPCOCK (MISCELLANEOUS) ×2 IMPLANT
TUBING CIL FLEX 10 FLL-RA (TUBING) ×2 IMPLANT
WIRE ASAHI PROWATER 180CM (WIRE) ×2 IMPLANT

## 2020-02-23 NOTE — ED Provider Notes (Signed)
[facility] EMERGENCY DEPARTMENT Provider Note   CSN: [id number] Arrival date & time: 02/22/20  1819     History Chief Complaint  Patient presents with  . Chest Pain    Allen Burns is a 59 y.o. male.  Patient c/o chest pain. Symptoms acute onset in past day, moderate, constant, mid to lower chest, occasionally radiating to arms, with nausea, felt sweaty earlier, mild dyspnea with exertion. No pleuritic pain. Denies cough or uri symptoms. No fever or chills. No leg pain or swelling. No hx dvt or pe. +recent cardiac ct and was told he needed cardiac cath. Denies heartburn. No abd/flank pain.   The history is provided by the patient.  Chest Pain Associated symptoms: nausea   Associated symptoms: no abdominal pain, no back pain, no cough, no fever and no headache        Past Medical History:  Diagnosis Date  . Asthma   . Erectile dysfunction   . Hyperlipidemia     Patient Active Problem List   Diagnosis Date Noted  . Hyperlipidemia   . Erectile dysfunction     Past Surgical History:  Procedure Laterality Date  . UMBILICAL HERNIA REPAIR  1975       Family History  Problem Relation Age of Onset  . CAD Mother        3V CABG in his 17s  . Lung cancer Father   . Breast cancer Father   . Colon cancer Sister        died age 79    Social History   Tobacco Use  . Smoking status: Former Games developer  . Smokeless tobacco: Never Used  Substance Use Topics  . Alcohol use: Not Currently  . Drug use: Not on file    Home Medications Prior to Admission medications   Medication Sig Start Date End Date Taking? Authorizing Provider  metoprolol tartrate (LOPRESSOR) 25 MG tablet TAKE 1 TABLET 1 HR PRIOR TO CARDIAC PROCEDURE 01/18/20   Jodelle Red, MD  sildenafil (VIAGRA) 50 MG tablet Take 50 mg by mouth daily as needed.    [provider]    Allergies    Patient has no known allergies.  Review of Systems   Review of Systems    Constitutional: Negative for fever.  HENT: Negative for sore throat.   Eyes: Negative for redness.  Respiratory: Negative for cough.   Cardiovascular: Positive for chest pain. Negative for leg swelling.  Gastrointestinal: Positive for nausea. Negative for abdominal pain.  Genitourinary: Negative for flank pain.  Musculoskeletal: Negative for back pain and neck pain.  Skin: Negative for rash.  Neurological: Negative for headaches.  Hematological: Does not bruise/bleed easily.  Psychiatric/Behavioral: Negative for confusion.    Physical Exam Updated Vital Signs BP 113/65   Pulse 94   Temp 98.8 F (37.1 C) (Oral)   Resp 11   Ht 1.753 m (5\' 9" )   Wt 82.1 kg   SpO2 98%   BMI 26.73 kg/m   Physical Exam Vitals and nursing note reviewed.  Constitutional:      Appearance: Normal appearance. He is well-developed.  HENT:     Head: Atraumatic.     Nose: Nose normal.     Mouth/Throat:     Mouth: Mucous membranes are moist.  Eyes:     General: No scleral icterus.    Conjunctiva/sclera: Conjunctivae normal.  Neck:     Trachea: No tracheal deviation.  Cardiovascular:     Rate and Rhythm: Normal rate  and regular rhythm.     Pulses: Normal pulses.     Heart sounds: Normal heart sounds. No murmur heard.  No friction rub. No gallop.   Pulmonary:     Effort: Pulmonary effort is normal. No accessory muscle usage or respiratory distress.     Breath sounds: Normal breath sounds.  Chest:     Chest wall: No tenderness.  Abdominal:     General: Bowel sounds are normal. There is no distension.     Palpations: Abdomen is soft.     Tenderness: There is no abdominal tenderness.  Genitourinary:    Comments: No cva tenderness. Musculoskeletal:        General: No swelling or tenderness.     Cervical back: Normal range of motion and neck supple. No rigidity.  Skin:    General: Skin is warm and dry.     Findings: No rash.  Neurological:     Mental Status: He is alert.     Comments:  Alert, speech clear.   Psychiatric:        Mood and Affect: Mood normal.     ED Results / Procedures / Treatments   Labs (all labs ordered are listed, but only abnormal results are displayed) Results for orders placed or performed during the hospital encounter of 02/22/20  Basic metabolic panel  Result Value Ref Range   Sodium 136 135 - 145 mmol/L   Potassium 4.0 3.5 - 5.1 mmol/L   Chloride 101 98 - 111 mmol/L   CO2 25 22 - 32 mmol/L   Glucose, Bld 120 (H) 70 - 99 mg/dL   BUN 15 6 - 20 mg/dL   Creatinine, Ser 1.610.97 0.61 - 1.24 mg/dL   Calcium 9.2 8.9 - 09.610.3 mg/dL   GFR, Estimated >04>60 >54>60 mL/min   Anion gap 10 5 - 15  CBC  Result Value Ref Range   WBC 8.7 4.0 - 10.5 K/uL   RBC 5.39 4.22 - 5.81 MIL/uL   Hemoglobin 15.9 13.0 - 17.0 g/dL   HCT 09.849.7 39 - 52 %   MCV 92.2 80.0 - 100.0 fL   MCH 29.5 26.0 - 34.0 pg   MCHC 32.0 30.0 - 36.0 g/dL   RDW 11.912.2 14.711.5 - 82.915.5 %   Platelets 271 150 - 400 K/uL   nRBC 0.0 0.0 - 0.2 %  Troponin I (High Sensitivity)  Result Value Ref Range   Troponin I (High Sensitivity) 4 <18 ng/L  Troponin I (High Sensitivity)  Result Value Ref Range   Troponin I (High Sensitivity) 3 <18 ng/L   DG Chest 2 View  Result Date: 02/22/2020 CLINICAL DATA:  59 year old male with chest pain. EXAM: CHEST - 2 VIEW COMPARISON:  Chest radiograph dated 12/05/2016. FINDINGS: The heart size and mediastinal contours are within normal limits. Both lungs are clear. The visualized skeletal structures are unremarkable. IMPRESSION: No active cardiopulmonary disease. Electronically Signed   By: Elgie CollardArash  Radparvar M.D.   On: 02/22/2020 19:26   CT CORONARY MORPH W/CTA COR W/SCORE W/CA W/CM &/OR WO/CM  Addendum Date: 02/09/2020   ADDENDUM REPORT: 02/09/2020 17:11 EXAM: Cardiac/Coronary  CT TECHNIQUE: The patient was scanned on a [employer] scanner. FINDINGS: A 120 kV prospective scan was triggered in the descending thoracic aorta at 111 HU's. Axial non-contrast 3 mm slices were  carried out through the heart. The data set was analyzed on a dedicated work station and scored using the Agatson method. Gantry rotation speed was 250 msecs and collimation was .6  mm. No beta blockade and 0.8 mg of sl NTG was given. The 3D data set was reconstructed in 5% intervals of the 67-82 % of the R-R cycle. Diastolic phases were analyzed on a dedicated work station using MPR, MIP and VRT modes. The patient received 80 cc of contrast. Aorta:  Normal size.  No calcifications.  No dissection. Aortic Valve:  Trileaflet.  No calcifications. Coronary Arteries:  Normal coronary origin.  Right dominance. RCA is a large dominant artery that gives rise to PDA and PLVB. There is no plaque. Left main is a large artery that gives rise to LAD and LCX arteries. There is no plaque. LAD is a large vessel that gives rise to a small D1 and a large branching D2. There is minimal calcified plaque in the proximal LAD with associated stenosis of 0-24%. There is moderate mixed plaque in the mid LAD with associated stenosis of 50-69%. LCX is a non-dominant artery that gives rise to a small OM1 branch and large OM2 branch. There is mixed calcified plaque in the mid LCx with associated stenosis of 50-69% but possibly > 70%. Other findings: Normal pulmonary vein drainage into the left atrium. Normal let atrial appendage without a thrombus. Normal size of the pulmonary artery. IMPRESSION: 1. Coronary calcium score of 127. This was 81st percentile for age and sex matched control. 2.  Normal coronary origin with right dominance. 3.  Moderate atherosclerosis.  CAD RADS 3. 4. Consider symptom-guided anti-ischemic and preventive pharmacotherapy as well as risk factor modification per guideline-directed care. 5.  This study has been submitted for FFR flow analysis. Armanda Magic Electronically Signed   By: Armanda Magic   On: 02/09/2020 17:11   Result Date: 02/09/2020 EXAM: OVER-READ INTERPRETATION  CT CHEST The following report is an  over-read performed by radiologist Dr. Trudie Reed of [facility] on 02/08/2020. This over-read does not include interpretation of cardiac or coronary anatomy or pathology. The coronary calcium score/coronary CTA interpretation by the cardiologist is attached. COMPARISON:  None. FINDINGS: Within the visualized portions of the thorax there are no suspicious appearing pulmonary nodules or masses, there is no acute consolidative airspace disease, no pleural effusions, no pneumothorax and no lymphadenopathy. Visualized portions of the upper abdomen are unremarkable. There are no aggressive appearing lytic or blastic lesions noted in the visualized portions of the skeleton. IMPRESSION: No significant incidental noncardiac findings are noted. Electronically Signed: By: Trudie Reed M.D. On: 02/08/2020 08:30   CT CORONARY FRACTIONAL FLOW RESERVE DATA PREP  Result Date: 02/11/2020 EXAM: FFRCT ANALYSIS FINDINGS: FFRct analysis was performed on the original cardiac CT angiogram dataset. Diagrammatic representation of the FFRct analysis is provided in a separate PDF document in PACS. This dictation was created using the PDF document and an interactive 3D model of the results. 3D model is not available in the EMR/PACS. Normal FFR range is >0.80. 1. Left Main: No significant stenosis.  LM FFR = 0.99. 2. LAD: NO significant stenosis. Proximal FFR = 0.97, mid FFR = 0.80, Distal FFR = 0.72. 3. LCX: Possible flow limiting lesion in proximal to mid LCx. Proximal FFR = 1.00, Mid FFR = 0.61, Distal FFR = 0.52. 4. Ramus: No significant stenosis. Proximal FFR =, Mid FFR=, Distal FFR =. 5. RCA: No significant stenosis. Proximal FFR = 0.99, Mid FFR = 0.96, Distal FFR = 0.93. IMPRESSION: 1. Coronary CT FFR flow analysis demonstrates possible flow limiting lesion in the proximal to mid LCx and gradual decrease in flow in LAD  likely related to small vessel disease. 2.  Recommend cardiac catheterization. Traci Turner  Electronically Signed   By: Armanda Magic   On: 02/11/2020 15:16    EKG EKG Interpretation  Date/Time:  Monday February 22 2020 18:34:03 EST Ventricular Rate:  80 PR Interval:  168 QRS Duration: 74 QT Interval:  352 QTC Calculation: 405 R Axis:   81 Text Interpretation: Normal sinus rhythm with sinus arrhythmia Normal ECG No old tracing to compare Confirmed by Dione Booze ([id number]) on 02/23/2020 2:13:26 AM   Radiology DG Chest 2 View  Result Date: 02/22/2020 CLINICAL DATA:  59 year old male with chest pain. EXAM: CHEST - 2 VIEW COMPARISON:  Chest radiograph dated 12/05/2016. FINDINGS: The heart size and mediastinal contours are within normal limits. Both lungs are clear. The visualized skeletal structures are unremarkable. IMPRESSION: No active cardiopulmonary disease. Electronically Signed   By: Elgie Collard M.D.   On: 02/22/2020 19:26    Procedures Procedures (including critical care time)  Medications Ordered in ED Medications - No data to display  ED Course  I have reviewed the triage vital signs and the nursing notes.  Pertinent labs & imaging results that were available during my care of the patient were reviewed by me and considered in my medical decision making (see chart for details).    MDM Rules/Calculators/A&P                          Iv ns. Continuous pulse ox and cardiac monitoring - sinus rhythm.  Reviewed nursing notes and prior charts for additional history.  Recent cardiac ct reviewed.   MDM Number of Diagnoses or Management Options   Amount and/or Complexity of Data Reviewed Clinical lab tests: ordered and reviewed Tests in the radiology section of CPT: ordered and reviewed Tests in the medicine section of CPT: ordered and reviewed Discussion of test results with the performing providers: yes Decide to obtain previous medical records or to obtain history from someone other than the patient: yes Obtain history from someone other than the  patient: yes Review and summarize past medical records: yes Discuss the patient with other providers: yes Independent visualization of images, tracings, or specimens: yes  Risk of Complications, Morbidity, and/or Mortality Presenting problems: high Diagnostic procedures: high Management options: high   Labs reviewed/interpreted by me - trop normal.  CXR reviewed/interpreted by me - no pna.  ASA po.   Cardiology consulted. Discussed pt - they will see in ed, possible cath/admit today. They request covid swab be done - ordered.  Recheck pt, comfortable, no distress, updated on plan.      Final Clinical Impression(s) / ED Diagnoses Final diagnoses:  None    Rx / DC Orders ED Discharge Orders    None       Cathren Laine, MD 02/23/20 [phone]

## 2020-02-23 NOTE — Progress Notes (Signed)
Discussed stent, Brilinta, restrictions, diet, exercise, NTG, and CRPII. Pt very receptive and thankful. Will refer to [facility] CRPII. Encouraged to slowly move into jogging and yoga. Understands importance of Brilinta. Is trying to manage stress. [phone] Ethelda Chick CES, ACSM 3:30 PM 02/23/2020

## 2020-02-23 NOTE — Discharge Summary (Signed)
Discharge Summary for Same Day PCI (Inpt to out)   Patient ID: Allen Burns MRN: [id number]; DOB: October 11, 1960  Admit date: 02/22/2020 Discharge date: 02/23/2020  Primary Care Provider: Farris Has, MD  Primary Cardiologist: Jodelle Red, MD  Primary Electrophysiologist:  None   Discharge Diagnoses    Principal Problem:   Unstable angina [facility]) Active Problems:   Hyperlipidemia   Chest pain    Diagnostic Studies/Procedures    Cardiac Catheterization 02/23/2020:   Prox LAD to Mid LAD lesion is 40% stenosed.  Mid Cx lesion is 90% stenosed.  Post intervention, there is a 0% residual stenosis.  A drug-eluting stent was successfully placed using a STENT RESOLUTE ONYX 3.0X18.  The left ventricular systolic function is normal.  LV end diastolic pressure is normal.  The left ventricular ejection fraction is 55-65% by visual estimate.   1. Single vessel obstructive CAD 2. Normal LV function 3. Normal LVEDP 4. Successful PCI of the mid LCx with DES x 1 and IVUS guidance.  Plan: DAPT for one year. Anticipate same day DC.   Diagnostic Dominance: Right  Intervention    _____________   History of Present Illness     Allen Burns is a 59 y.o. male with PMH of exertional chest pain, erectile dysfunction, former tobacco use, family history heart disease who presented to the ED with chest pain at rest with associated nausea, emesis, diaphoresis. Patient with recent CT coronary revealing 50-69% stenosis of mid LAD and 50-69% (possibly >70%) stenosis of mid LCX. Allen Burns presented to the ED 11/8 due to sharp, persistent, chest pain that started after he read his CT coronary results. Patient has history of exertional chest pain and recently received CT coronary angio. 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 He reported the pain worsening throughout the day and had associated fever, chills, and sweats. He noted multiple episodes of emesis and after  the emesis, his symptoms improved. The patient called the cardiology office, and reported he was offered tele health visit which he did not prefer.  He reported having chest pain in the past when he would shovel ditches in his backyard.   He is a patient of Dr. Di Kindle and was recently evaluated for his chest pain.  A CT coronary was performed, revealing 50-69% stenosis of mid LAD and 50-69% (possibly >70%) stenosis of mid LCX. Fractional flow was also performed with LAD Proximal FFR = 0.97, mid FFR =0.80, Distal FFR = 0.72.Marland Kitchen LCX Proximal FFR = 1.00, Mid FFR = 0.61, Distal FFR = 0.52.  Because of the patient's recurrent chest pain and CT coronary results, medical therapy and cardiac catheterization was recommended.  He was admitted and taken to the cath lab.    Hospital Course     The patient underwent cardiac cath as noted above with 90% mLcx with PCI/DESx1 with IVUS guidance. Normal LV function, and LVEDP. Plan for DAPT with ASA/Brilinta for at least one year. The patient was seen by cardiac rehab while in short stay. There were no observed complications post cath. Radial cath site was re-evaluated prior to discharge and found to be stable without any complications. Instructions/precautions regarding cath site care were given prior to discharge. He was also started on high dose statin, along with low dose metoprolol.   Allen Burns was seen by Dr. [place] and determined stable for discharge home. Follow up with our office has been arranged. Medications are listed below. Pertinent changes include ASA, Brilinta, statin, BB.  _____________  Cath/PCI Registry Performance & Quality Measures: 1. Aspirin prescribed? - Yes 2. ADP Receptor Inhibitor (Plavix/Clopidogrel, Brilinta/Ticagrelor or Effient/Prasugrel) prescribed (includes medically managed patients)? - Yes 3. High Intensity Statin (Lipitor 40-80mg  or Crestor 20-40mg ) prescribed? - Yes 4. For EF <40%, was ACEI/ARB prescribed? - Not  Applicable (EF >/= 40%) 5. For EF <40%, Aldosterone Antagonist (Spironolactone or Eplerenone) prescribed? - Not Applicable (EF >/= 40%) 6. Cardiac Rehab Phase II ordered (Included Medically managed Patients)? - Yes  _____________   Discharge Vitals Blood pressure 109/63, pulse 74, temperature 98.8 F (37.1 C), temperature source Oral, resp. rate 14, height  (1.753 m), weight 82.1 kg, SpO2 96 %.  Filed Weights   02/22/20 1854  Weight: 82.1 kg    Last Labs & Radiologic Studies    CBC Recent Labs    02/22/20 1856  WBC 8.7  HGB 15.9  HCT 49.7  MCV 92.2  PLT 271   Basic Metabolic Panel Recent Labs    16/10/96 1856  NA 136  K 4.0  CL 101  CO2 25  GLUCOSE 120*  BUN 15  CREATININE 0.97  CALCIUM 9.2   Liver Function Tests No results for input(s): AST, ALT, ALKPHOS, BILITOT, PROT, ALBUMIN in the last 72 hours. No results for input(s): LIPASE, AMYLASE in the last 72 hours. High Sensitivity Troponin:   Recent Labs  Lab 02/22/20 1856 02/22/20 2135  TROPONINIHS 4 3    BNP Invalid input(s): POCBNP D-Dimer No results for input(s): DDIMER in the last 72 hours. Hemoglobin A1C No results for input(s): HGBA1C in the last 72 hours. Fasting Lipid Panel No results for input(s): CHOL, HDL, LDLCALC, TRIG, CHOLHDL, LDLDIRECT in the last 72 hours. Thyroid Function Tests No results for input(s): TSH, T4TOTAL, T3FREE, THYROIDAB in the last 72 hours.  Invalid input(s): FREET3 _____________  DG Chest 2 View  Result Date: 02/22/2020 CLINICAL DATA:  59 year old male with chest pain. EXAM: CHEST - 2 VIEW COMPARISON:  Chest radiograph dated 12/05/2016. FINDINGS: The heart size and mediastinal contours are within normal limits. Both lungs are clear. The visualized skeletal structures are unremarkable. IMPRESSION: No active cardiopulmonary disease. Electronically Signed   By: Elgie Collard M.D.   On: 02/22/2020 19:26   CARDIAC CATHETERIZATION  Result Date: 02/23/2020  Prox  LAD to Mid LAD lesion is 40% stenosed.  Mid Cx lesion is 90% stenosed.  Post intervention, there is a 0% residual stenosis.  A drug-eluting stent was successfully placed using a STENT RESOLUTE ONYX 3.0X18.  The left ventricular systolic function is normal.  LV end diastolic pressure is normal.  The left ventricular ejection fraction is 55-65% by visual estimate.  1. Single vessel obstructive CAD 2. Normal LV function 3. Normal LVEDP 4. Successful PCI of the mid LCx with DES x 1 and IVUS guidance. Plan: DAPT for one year. Anticipate same day DC.   CT CORONARY MORPH W/CTA COR W/SCORE W/CA W/CM &/OR WO/CM  Addendum Date: 02/09/2020   ADDENDUM REPORT: 02/09/2020 17:11 EXAM: Cardiac/Coronary  CT TECHNIQUE: The patient was scanned on a [employer]. FINDINGS: A 120 kV prospective scan was triggered in the descending thoracic aorta at 111 HU's. Axial non-contrast 3 mm slices were carried out through the heart. The data set was analyzed on a dedicated work station and scored using the Agatson method. Gantry rotation speed was 250 msecs and collimation was .6 mm. No beta blockade and 0.8 mg of sl NTG was given. The 3D data set was reconstructed in 5%  intervals of the 67-82 % of the R-R cycle. Diastolic phases were analyzed on a dedicated work station using MPR, MIP and VRT modes. The patient received 80 cc of contrast. Aorta:  Normal size.  No calcifications.  No dissection. Aortic Valve:  Trileaflet.  No calcifications. Coronary Arteries:  Normal coronary origin.  Right dominance. RCA is a large dominant artery that gives rise to PDA and PLVB. There is no plaque. Left main is a large artery that gives rise to LAD and LCX arteries. There is no plaque. LAD is a large vessel that gives rise to a small D1 and a large branching D2. There is minimal calcified plaque in the proximal LAD with associated stenosis of 0-24%. There is moderate mixed plaque in the mid LAD with associated stenosis of 50-69%. LCX is a  non-dominant artery that gives rise to a small OM1 branch and large OM2 branch. There is mixed calcified plaque in the mid LCx with associated stenosis of 50-69% but possibly > 70%. Other findings: Normal pulmonary vein drainage into the left atrium. Normal let atrial appendage without a thrombus. Normal size of the pulmonary artery. IMPRESSION: 1. Coronary calcium score of 127. This was 81st percentile for age and sex matched control. 2.  Normal coronary origin with right dominance. 3.  Moderate atherosclerosis.  CAD RADS 3. 4. Consider symptom-guided anti-ischemic and preventive pharmacotherapy as well as risk factor modification per guideline-directed care. 5.  This study has been submitted for FFR flow analysis. Armanda Magic Electronically Signed   By: Armanda Magic   On: 02/09/2020 17:11   Result Date: 02/09/2020 EXAM: OVER-READ INTERPRETATION  CT CHEST The following report is an over-read performed by radiologist Dr. Trudie Reed of [facility] on 02/08/2020. This over-read does not include interpretation of cardiac or coronary anatomy or pathology. The coronary calcium score/coronary CTA interpretation by the cardiologist is attached. COMPARISON:  None. FINDINGS: Within the visualized portions of the thorax there are no suspicious appearing pulmonary nodules or masses, there is no acute consolidative airspace disease, no pleural effusions, no pneumothorax and no lymphadenopathy. Visualized portions of the upper abdomen are unremarkable. There are no aggressive appearing lytic or blastic lesions noted in the visualized portions of the skeleton. IMPRESSION: No significant incidental noncardiac findings are noted. Electronically Signed: By: Trudie Reed M.D. On: 02/08/2020 08:30   CT CORONARY FRACTIONAL FLOW RESERVE DATA PREP  Result Date: 02/11/2020 EXAM: FFRCT ANALYSIS FINDINGS: FFRct analysis was performed on the original cardiac CT angiogram dataset. Diagrammatic representation of  the FFRct analysis is provided in a separate PDF document in PACS. This dictation was created using the PDF document and an interactive 3D model of the results. 3D model is not available in the EMR/PACS. Normal FFR range is >0.80. 1. Left Main: No significant stenosis.  LM FFR = 0.99. 2. LAD: NO significant stenosis. Proximal FFR = 0.97, mid FFR = 0.80, Distal FFR = 0.72. 3. LCX: Possible flow limiting lesion in proximal to mid LCx. Proximal FFR = 1.00, Mid FFR = 0.61, Distal FFR = 0.52. 4. Ramus: No significant stenosis. Proximal FFR =, Mid FFR=, Distal FFR =. 5. RCA: No significant stenosis. Proximal FFR = 0.99, Mid FFR = 0.96, Distal FFR = 0.93. IMPRESSION: 1. Coronary CT FFR flow analysis demonstrates possible flow limiting lesion in the proximal to mid LCx and gradual decrease in flow in LAD likely related to small vessel disease. 2.  Recommend cardiac catheterization. Traci Turner Electronically Signed   By: Gloris Manchester  Turner   On: 02/11/2020 15:16    Disposition   Pt is being discharged home today in good condition.  Follow-up Plans & Appointments     Follow-up Information    Jodelle Redhristopher, Bridgette, MD Follow up on 03/04/2020.   Specialty: Cardiology Why: at 8am for your follow up appt.  Contact information: [address] [place] [id number] [phone]              Discharge Instructions    Amb Referral to Cardiac Rehabilitation   Complete by: As directed    Diagnosis:  Coronary Stents PTCA     After initial evaluation and assessments completed: Virtual Based Care may be provided alone or in conjunction with Phase 2 Cardiac Rehab based on patient barriers.: Yes   Diet - low sodium heart healthy   Complete by: As directed    Discharge instructions   Complete by: As directed    Radial Site Care Refer to this sheet in the next few weeks. These instructions provide you with information on caring for yourself after your procedure. Your caregiver may also give you  more specific instructions. Your treatment has been planned according to current medical practices, but problems sometimes occur. Call your caregiver if you have any problems or questions after your procedure. HOME CARE INSTRUCTIONS You may shower the day after the procedure.Remove the bandage (dressing) and gently wash the site with plain soap and water.Gently pat the site dry.  Do not apply powder or lotion to the site.  Do not submerge the affected site in water for 3 to 5 days.  Inspect the site at least twice daily.  Do not flex or bend the affected arm for 24 hours.  No lifting over 5 pounds (2.3 kg) for 5 days after your procedure.  Do not drive home if you are discharged the same day of the procedure. Have someone else drive you.  You may drive 24 hours after the procedure unless otherwise instructed by your caregiver.  What to expect: Any bruising will usually fade within 1 to 2 weeks.  Blood that collects in the tissue (hematoma) may be painful to the touch. It should usually decrease in size and tenderness within 1 to 2 weeks.  SEEK IMMEDIATE MEDICAL CARE IF: You have unusual pain at the radial site.  You have redness, warmth, swelling, or pain at the radial site.  You have drainage (other than a small amount of blood on the dressing).  You have chills.  You have a fever or persistent symptoms for more than 72 hours.  You have a fever and your symptoms suddenly get worse.  Your arm becomes pale, cool, tingly, or numb.  You have heavy bleeding from the site. Hold pressure on the site.   PLEASE DO NOT MISS ANY DOSES OF YOUR BRILINTA/!!!!! Also keep a log of you blood pressures and bring back to your follow up appt. Please call the office with any questions.   Patients taking blood thinners should generally stay away from medicines like ibuprofen, Advil, Motrin, naproxen, and Aleve due to risk of stomach bleeding. You may take Tylenol as directed or talk to your primary doctor  about alternatives.  PLEASE ENSURE THAT YOU DO NOT RUN OUT OF YOUR BRILINTA/PLAVIX. This medication is very important to remain on for at least one year. IF you have issues obtaining this medication due to cost please CALL the office 3-5 business days prior to running out in order to prevent missing  doses of this medication.   Increase activity slowly   Complete by: As directed        Discharge Medications   Allergies as of 02/23/2020   No Known Allergies     Medication List    STOP taking these medications   acetaminophen 325 MG tablet Commonly known as: TYLENOL   sildenafil 50 MG tablet Commonly known as: VIAGRA     TAKE these medications   aspirin 81 MG EC tablet Take 1 tablet (81 mg total) by mouth daily. Swallow whole. Start taking on: February 24, 2020   atorvastatin 80 MG tablet Commonly known as: Lipitor Take 1 tablet (80 mg total) by mouth daily.   DRY EYES OP Apply 1 drop to eye daily as needed (for dry eyes).   metoprolol tartrate 25 MG tablet Commonly known as: LOPRESSOR Take 0.5 tablets (12.5 mg total) by mouth 2 (two) times daily.   nitroGLYCERIN 0.4 MG SL tablet Commonly known as: Nitrostat Place 1 tablet (0.4 mg total) under the tongue every 5 (five) minutes as needed for chest pain.   ticagrelor 90 MG Tabs tablet Commonly known as: Brilinta Take 1 tablet (90 mg total) by mouth 2 (two) times daily.       Allergies No Known Allergies  Outstanding Labs/Studies   FLP/LFTs in 8 weeks  Duration of Discharge Encounter   Greater than 30 minutes including physician time.  Signed, Laverda Page, NP 02/23/2020, 4:27 PM

## 2020-02-23 NOTE — H&P (Addendum)
Cardiology Admission History and Physical:   Patient ID: Allen Burns MRN: [id number]; DOB: 11/08/1960   Admission date: 02/22/2020  Primary Care Provider: Farris Has, MD [facility] Cardiologist: Jodelle Red, MD  [facility] Electrophysiologist:  None   Chief Complaint: Chest pain  Patient Profile:   Allen Burns is a 59 y.o. male with a Hx of exertional chest pain, erectile dysfunction, former tobacco use, family history heart disease who presented to the ED with chest pain at rest with associated nausea, emesis, diaphoresis. Patient with recent CT coronary revealing 50-69% stenosis of mid LAD and 50-69% (possibly >70%) stenosis of mid LCX.  He will be admitted for a cardiac catheterization   History of Present Illness:   Mr. Allen Burns presented to the ED yesterday due to sharp, persistent, chest pain that started after he read his CT coronary results. He was seen at bedside by myself and Dr. Cristal Deer, patient's wife at bedside as well. Patient has history of exertional chest pain and recently received CT coronary angio. 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 He endorsed the pain worsening throughout the day and had associated fever, chills, and sweats. He notes multiple episodes of emesis and after the emesis, his symptoms improved. The patient called the cardiology office, but notes he was offered tele health visit which he did not prefer.  He states he currently has mild central chest pain. He endorses having chest pain in the past when he would shovel ditches in his backyard. He notes alter his diet lately to include less fast food and desserts.   He is a patient of Dr. Di Kindle and was recently evaluated for his chest pain.  A CT coronary was performed, revealing 50-69% stenosis of mid LAD and 50-69% (possibly >70%) stenosis of mid LCX. Fractional flow was also performed with LAD Proximal FFR = 0.97, mid FFR =0.80, Distal FFR = 0.72.Marland Kitchen LCX  Proximal FFR = 1.00, Mid FFR = 0.61, Distal FFR = 0.52.  Because of the patient's recurrent chest pain and CT coronary results, medical therapy and cardiac catheterization were discussed with the patient, including risks and benefits. Discussed with patient that medical therapy includes statin, aspirin, and anti-anginal's. Also discussed risk of cardiac catheterization, including side effects of pain, bruising, emboli, renal failure, radiation exposure, reaction to dye, MI,stroke, and death. Reassured patient that the chances of this occurring are low. Procedure itself was discussed in detail with the patient. Also discussed limitations post-operatively, but that true recommendations would be made once the procedure was completed.  After shared decision making with the patient and his wife, they wish to proceed with the cardiac catheterization. There questions were answered by Dr. Cristal Deer and had no other questions at the end of the examination.   Past Medical History:  Diagnosis Date  . Asthma   . Erectile dysfunction   . Hyperlipidemia     Past Surgical History:  Procedure Laterality Date  . UMBILICAL HERNIA REPAIR  1975     Medications Prior to Admission: Prior to Admission medications   Medication Sig Start Date End Date Taking? Authorizing Provider  acetaminophen (TYLENOL) 325 MG tablet Take 650 mg by mouth every 6 (six) hours as needed for moderate pain or headache.   Yes [provider]  Artificial Tear Ointment (DRY EYES OP) Apply 1 drop to eye daily as needed (for dry eyes).   Yes [provider]  sildenafil (VIAGRA) 50 MG tablet Take 50 mg by mouth daily  as needed for erectile dysfunction.    Yes [provider]    Allergies:   No Known Allergies  Social History:   Social History   Socioeconomic History  . Marital status: Divorced    Spouse name: Not on file  . Number of children: Not on file  . Years of education: Not on file  . Highest  education level: Not on file  Occupational History  . Not on file  Tobacco Use  . Smoking status: Former Games developer  . Smokeless tobacco: Never Used  Substance and Sexual Activity  . Alcohol use: Not Currently  . Drug use: Not on file  . Sexual activity: Not on file  Other Topics Concern  . Not on file  Social History Narrative  . Not on file   Social Determinants of Health   Financial Resource Strain:   . Difficulty of Paying Living Expenses: Not on file  Food Insecurity:   . Worried About Programme researcher, broadcasting/film/video in the Last Year: Not on file  . Ran Out of Food in the Last Year: Not on file  Transportation Needs:   . Lack of Transportation (Medical): Not on file  . Lack of Transportation (Non-Medical): Not on file  Physical Activity:   . Days of Exercise per Week: Not on file  . Minutes of Exercise per Session: Not on file  Stress:   . Feeling of Stress : Not on file  Social Connections:   . Frequency of Communication with Friends and Family: Not on file  . Frequency of Social Gatherings with Friends and Family: Not on file  . Attends Religious Services: Not on file  . Active Member of Clubs or Organizations: Not on file  . Attends Banker Meetings: Not on file  . Marital Status: Not on file  Intimate Partner Violence:   . Fear of Current or Ex-Partner: Not on file  . Emotionally Abused: Not on file  . Physically Abused: Not on file  . Sexually Abused: Not on file    Family History:  The patient's family history includes Breast cancer in his father; CAD in his mother; Colon cancer in his sister; Lung cancer in his father.    ROS:  Please see the history of present illness.  All other ROS reviewed and negative.     Physical Exam/Data:   Vitals:   02/23/20 0859 02/23/20 0900 02/23/20 0930 02/23/20 1000  BP:  96/62 107/65 116/68  Pulse:  82 90 85  Resp:  17 17 15   Temp:      TempSrc:      SpO2: 93% 95% 92% 95%  Weight:      Height:       No intake or  output data in the 24 hours ending 02/23/20 1051 Last 3 Weights 02/22/2020 01/18/2020  Weight (lbs) 181 lb 181 lb  Weight (kg) 82.1 kg 82.101 kg     Body mass index is 26.73 kg/m.  General:  Well nourished, well developed, in no acute distress HEENT: normal Endocrine:  No thryomegaly Cardiac:  normal S1, S2; RRR; no murmur Lungs:  clear to auscultation bilaterally, no wheezing, rhonchi or rales  Abd: soft, nontender, no hepatomegaly  Ext: no edema Musculoskeletal:  No deformities present Skin: warm and dry  Neuro: No focal abnormalities noted Psych:  Normal affect   EKG:  The ECG that was done was personally reviewed and demonstrates normal sinus rhythm, rate of 84. Normal PR and QTC intervals. No  ST wave changes. Compared to prior from 01/18/20  Relevant CV Studies: CT Coronary IMPRESSION: 1. Coronary calcium score of 127. This was 81st percentile for age and sex matched control. 2.  Normal coronary origin with right dominance. 3.  Moderate atherosclerosis.  CAD RADS 3. 4. Consider symptom-guided anti-ischemic and preventive pharmacotherapy as well as risk factor modification per guideline-directed care. 5.  This study has been submitted for FFR flow analysis.  Fractional Flow 1. Left Main: No significant stenosis.  LM FFR = 0.99. 2. LAD: NO significant stenosis. Proximal FFR = 0.97, mid FFR = 0.80, Distal FFR = 0.72. 3. LCX: Possible flow limiting lesion in proximal to mid LCx. Proximal FFR = 1.00, Mid FFR = 0.61, Distal FFR = 0.52. 4. Ramus: No significant stenosis. Proximal FFR =, Mid FFR=, Distal FFR =. 5. RCA: No significant stenosis. Proximal FFR = 0.99, Mid FFR = 0.96, Distal FFR = 0.93.  IMPRESSION: 1. Coronary CT FFR flow analysis demonstrates possible flow limiting lesion in the proximal to mid LCx and gradual decrease in flow in LAD likely related to small vessel disease. 2.  Recommend cardiac catheterization.  Laboratory Data:  High Sensitivity Troponin:    Recent Labs  Lab 02/22/20 1856 02/22/20 2135  TROPONINIHS 4 3      Chemistry Recent Labs  Lab 02/22/20 1856  NA 136  K 4.0  CL 101  CO2 25  GLUCOSE 120*  BUN 15  CREATININE 0.97  CALCIUM 9.2  GFRNONAA >60  ANIONGAP 10    No results for input(s): PROT, ALBUMIN, AST, ALT, ALKPHOS, BILITOT in the last 168 hours. Hematology Recent Labs  Lab 02/22/20 1856  WBC 8.7  RBC 5.39  HGB 15.9  HCT 49.7  MCV 92.2  MCH 29.5  MCHC 32.0  RDW 12.2  PLT 271   BNPNo results for input(s): BNP, PROBNP in the last 168 hours.  DDimer No results for input(s): DDIMER in the last 168 hours.   Radiology/Studies:  DG Chest 2 View  Result Date: 02/22/2020 CLINICAL DATA:  59 year old male with chest pain. EXAM: CHEST - 2 VIEW COMPARISON:  Chest radiograph dated 12/05/2016. FINDINGS: The heart size and mediastinal contours are within normal limits. Both lungs are clear. The visualized skeletal structures are unremarkable. IMPRESSION: No active cardiopulmonary disease. Electronically Signed   By: Elgie Collard M.D.   On: 02/22/2020 19:26    Assessment and Plan:   1. Exertional Chest Pain w/ evidence of stenosis on CT Coronary Angio Patient followed by Dr. Cristal Deer, of [facility]. History of exertional chest pain that occurred multiple times a week, associated with shortness of breath. Pain improves with rest. Presented to ED with persistent chest pain at rest, with associated sx of nausea, emesis, diaphoresis. Patient has multiple risk factors for CAD including family history of father 3V CABG in 14's, former smoker quit >10 years ago. Comorbidities of ED, asthma, history of poor diet that has recently improved. Most recent lipid panel of Tchol 157, TG 80, HDL 43, LDL 99. Troponins flat and EKG without evidence of infarction/ischemia. Do not believe patient is having infarction at this time, however, with recent findings of stenosis/possible flow limiting lesion of LAD and LCx on CT  Coronary and persistent angina, believe patient would benefit from catheterization at this time rather than medication management.  -received aspirin 324 in ED, 81 mg ordered daily -NPO at this time -plan for cardiac catheterization this afternoon  -CBC,BMP,lipid panel,hemoglobin A1c tomorrow morning -plan for DAPT at discharge  along with atorvastatin 80 mg -will counsel patient on recommend heart healthy/med diet.   2. Hyperlipidemia Most recent lipid panel of Tchol 157, TG 80, HDL 43, LDL 99.  Will start patient on 80 mg atorvastatin  3. Hx of Erectile Dysfunction On sildenafil at home. Will counsel on hypotension if discharged home on nitro.   ASCVD: 4.5% risk of cvd event in next 10 years.  Code Status: Full Code Diet: NPO at this time  Severity of Illness: The appropriate patient status for this patient is OBSERVATION. Observation status is judged to be reasonable and necessary in order to provide the required intensity of service to ensure the patient's safety. The patient's presenting symptoms, physical exam findings, and initial radiographic and laboratory data in the context of their medical condition is felt to place them at decreased risk for further clinical deterioration. Furthermore, it is anticipated that the patient will be medically stable for discharge from the hospital within 2 midnights of admission. The following factors support the patient status of observation.    The patient's presenting symptoms include chest pain, diaphoresis The physical exam was unremarkable  The initial radiographic and laboratory data are Coronary CT FFR flow analysis demonstrates possible flow limiting lesion in the proximal to mid LCx and gradual decrease in flow in LAD likely related to small vessel disease.Recommend cardiac catheterization..  For questions or updates, please contact [facility] Please consult [email or web address] for contact info under   Signed, Thalia BloodgoodVasili Raigan Baria, MD    Internal Medicine Resident PGY-1 02/23/2020 10:51 AM

## 2020-02-23 NOTE — Discharge Instructions (Addendum)
INCREASE FLUIDS FOR 48 HOURS  TO FLUSH YOUR KIDNEYS KEEP RIGHT WRIST ELEVATED 24 HOURS   Radial Site Care  This sheet gives you information about how to care for yourself after your procedure. Your health care provider may also give you more specific instructions. If you have problems or questions, contact your health care provider. What can I expect after the procedure? After the procedure, it is common to have:  Bruising and tenderness at the catheter insertion area. Follow these instructions at home: Medicines  Take over-the-counter and prescription medicines only as told by your health care provider. Insertion site care  Follow instructions from your health care provider about how to take care of your insertion site. Make sure you: ? Wash your hands with soap and water before you change your bandage (dressing). If soap and water are not available, use hand sanitizer. ? Change your dressing as told by your health care provider. ? Leave stitches (sutures), skin glue, or adhesive strips in place. These skin closures may need to stay in place for 2 weeks or longer. If adhesive strip edges start to loosen and curl up, you may trim the loose edges. Do not remove adhesive strips completely unless your health care provider tells you to do that.  Check your insertion site every day for signs of infection. Check for: ? Redness, swelling, or pain. ? Fluid or blood. ? Pus or a bad smell. ? Warmth.  Do not take baths, swim, or use a hot tub until your health care provider approves.  You may shower 24-48 hours after the procedure, or as directed by your health care provider. ? Remove the dressing and gently wash the site with plain soap and water. ? Pat the area dry with a clean towel. ? Do not rub the site. That could cause bleeding.  Do not apply powder or lotion to the site. Activity   For 24 hours after the procedure, or as directed by your health care provider: ? Do not flex or bend  the affected arm. ? Do not push or pull heavy objects with the affected arm. ? Do not drive yourself home from the hospital or clinic. You may drive 24 hours after the procedure unless your health care provider tells you not to. ? Do not operate machinery or power tools.  Do not lift anything that is heavier than 10 lb (4.5 kg), or the limit that you are told, until your health care provider says that it is safe.  Ask your health care provider when it is okay to: ? Return to work or school. ? Resume usual physical activities or sports. ? Resume sexual activity. General instructions  If the catheter site starts to bleed, raise your arm and put firm pressure on the site. If the bleeding does not stop, get help right away. This is a medical emergency.  If you went home on the same day as your procedure, a responsible adult should be with you for the first 24 hours after you arrive home.  Keep all follow-up visits as told by your health care provider. This is important. Contact a health care provider if:  You have a fever.  You have redness, swelling, or yellow drainage around your insertion site. Get help right away if:  You have unusual pain at the radial site.  The catheter insertion area swells very fast.  The insertion area is bleeding, and the bleeding does not stop when you hold steady pressure on the area.  Your arm or hand becomes pale, cool, tingly, or numb. These symptoms may represent a serious problem that is an emergency. Do not wait to see if the symptoms will go away. Get medical help right away. Call your local emergency services (911 in the [place] Do not drive yourself to the hospital. Summary  After the procedure, it is common to have bruising and tenderness at the site.  Follow instructions from your health care provider about how to take care of your radial site wound. Check the wound every day for signs of infection.  Do not lift anything that is heavier than  10 lb (4.5 kg), or the limit that you are told, until your health care provider says that it is safe. This information is not intended to replace advice given to you by your health care provider. Make sure you discuss any questions you have with your health care provider. Document Revised: 05/08/2017 Document Reviewed: 05/08/2017 Elsevier Patient Education  2020 [employer].      Information about your medication: Brilinta (anti-platelet agent)  Generic Name (Brand): ticagrelor (Brilinta), twice daily medication  PURPOSE: You are taking this medication along with aspirin to lower your chance of having a heart attack, stroke, or blood clots in your heart stent. These can be fatal. Brilinta and aspirin help prevent platelets from sticking together and forming a clot that can block an artery or your stent.   Common SIDE EFFECTS you may experience include: bruising or bleeding more easily, shortness of breath  Do not stop taking BRILINTA without talking to the doctor who prescribes it for you. People who are treated with a stent and stop taking Brilinta too soon, have a higher risk of getting a blood clot in the stent, having a heart attack, or dying. If you stop Brilinta because of bleeding, or for other reasons, your risk of a heart attack or stroke may increase.   Avoid taking NSAID agents or anti-inflammatory medications such as ibuprofen, naproxen given increased bleed risk with plavix - can use acetaminophen (Tylenol) if needed for pain.  Tell all of your doctors and dentists that you are taking Brilinta. They should talk to the doctor who prescribed Brilinta for you before you have any surgery or invasive procedure.   Contact your health care provider if you experience: severe or uncontrollable bleeding, pink/red/brown urine, vomiting blood or vomit that looks like "coffee grounds", red or black stools (looks like tar), coughing up blood or blood  clots ----------------------------------------------------------------------------------------------------------------------

## 2020-02-23 NOTE — Interval H&P Note (Signed)
History and Physical Interval Note:  02/23/2020 1:12 PM  Allen Burns  has presented today for surgery, with the diagnosis of chest pain.  The various methods of treatment have been discussed with the patient and family. After consideration of risks, benefits and other options for treatment, the patient has consented to  Procedure(s): LEFT HEART CATH AND CORONARY ANGIOGRAPHY (N/A) as a surgical intervention.  The patient's history has been reviewed, patient examined, no change in status, stable for surgery.  I have reviewed the patient's chart and labs.  Questions were answered to the patient's satisfaction.   Cath Lab Visit (complete for each Cath Lab visit)  Clinical Evaluation Leading to the Procedure:   ACS: Yes.    Non-ACS:    Anginal Classification: CCS II  Anti-ischemic medical therapy: No Therapy  Non-Invasive Test Results: Intermediate-risk stress test findings: cardiac mortality 1-3%/year  Prior CABG: No previous CABG        Theron Arista [facility] 02/23/2020 1:12 PM

## 2020-02-23 NOTE — ED Notes (Signed)
transferred care of pt to cath lab nurse

## 2020-02-24 ENCOUNTER — Encounter (HOSPITAL_COMMUNITY): Payer: Self-pay | Admitting: Cardiology

## 2020-02-25 ENCOUNTER — Telehealth: Payer: Self-pay | Admitting: Cardiology

## 2020-02-25 NOTE — Telephone Encounter (Signed)
Follow Up:    Pt is returning a call from today, he does not know who called him. He says he does have some questions please.

## 2020-02-25 NOTE — Telephone Encounter (Signed)
Pt states he has been doing well post cardiac cath but does occasionally notice what he says is a "twinge" or brief fluttering in his chest. He state is is not painful, but feel more like indigestion. States he noticed it yesterday, but passed after he took his medication for the day. Advised pt that oral medications can take 30 minutes to an hour to become fully effective.  Pt expresses questions about the use of sublingual nitroglycerin.  Counseled pt that he can take nitroglycerin for chest pain, pressure or tightness and provided education about hypotension and headaches. Suggested that pt log his blood pressures, heart rates, and symptoms with details about medication administration and activity noted for context. Advised pt that he can try medication for indigestion, but not to delay nitroglycerin for symptoms. Confirmed follow up appointment date and time with pt and instructed pt to bring medication bottles with him.   Pt verbalizes understanding.

## 2020-02-26 ENCOUNTER — Telehealth (HOSPITAL_COMMUNITY): Payer: Self-pay

## 2020-02-26 NOTE — Telephone Encounter (Signed)
Called patient to see if he is interested in the Cardiac Rehab Program. Patient expressed interest. Explained scheduling process and went over insurance, patient verbalized understanding. Will contact patient for scheduling once f/u has been completed.  °

## 2020-02-26 NOTE — Telephone Encounter (Signed)
Pt insurance is active and benefits verified through [employer]. Co-pay $0.00, DED $500.00/$149.47 met, out of pocket $3,500.00/$1,358.05 met, co-insurance 20%. No pre-authorization required. Passport, 02/26/20 @ 3:38PM, [id number]#[id number]  Will contact patient to see if he is interested in the Cardiac Rehab Program. If interested, patient will need to complete follow up appt. Once completed, patient will be contacted for scheduling upon review by the RN Navigator.

## 2020-03-02 DIAGNOSIS — F4322 Adjustment disorder with anxiety: Secondary | ICD-10-CM | POA: Diagnosis not present

## 2020-03-04 ENCOUNTER — Other Ambulatory Visit: Payer: Self-pay

## 2020-03-04 ENCOUNTER — Ambulatory Visit (INDEPENDENT_AMBULATORY_CARE_PROVIDER_SITE_OTHER): Payer: BC Managed Care – PPO | Admitting: Cardiology

## 2020-03-04 ENCOUNTER — Encounter: Payer: Self-pay | Admitting: Cardiology

## 2020-03-04 VITALS — BP 112/70 | HR 58 | Ht 69.0 in | Wt 182.0 lb

## 2020-03-04 DIAGNOSIS — Z955 Presence of coronary angioplasty implant and graft: Secondary | ICD-10-CM

## 2020-03-04 DIAGNOSIS — I2510 Atherosclerotic heart disease of native coronary artery without angina pectoris: Secondary | ICD-10-CM | POA: Diagnosis not present

## 2020-03-04 DIAGNOSIS — E785 Hyperlipidemia, unspecified: Secondary | ICD-10-CM

## 2020-03-04 DIAGNOSIS — Z7189 Other specified counseling: Secondary | ICD-10-CM

## 2020-03-04 DIAGNOSIS — R002 Palpitations: Secondary | ICD-10-CM

## 2020-03-04 DIAGNOSIS — Z8249 Family history of ischemic heart disease and other diseases of the circulatory system: Secondary | ICD-10-CM

## 2020-03-04 NOTE — Progress Notes (Signed)
Cardiology Office Note:    Date:  03/04/2020   ID:  Allen Burns, DOB 1960/07/30, MRN [id number]  PCP:  London Pepper, MD  Cardiologist:  Buford Dresser, MD  Referring MD: London Pepper, MD   CC: follow up  History of Present Illness:    Allen Burns is a 59 y.o. male with a hx of erectile dysfunction, former tobacco use, family history of heart disease, CAD s/p PCI 02/23/20 who is seen for follow up today. I initially met him 10/4/21as a new consult at the request of London Pepper, MD for the evaluation and management of exertional chest discomfort.  Today: Seen for post cath/PCI follow up from 02/23/20. I saw him in the hospital at that time. Since that time, his heart "hurts". Feels like he needs to massage it. Feels like he has a tightness in his chest when he tries to be active. Also notes a fluttering in his chest. He notes that he has not had to stop with exertion like he did prior to the stent, but he is nervous   Has been getting headaches, slowly improving. Takes acetaminophen PRN. Rare palpitations during stress.   We discussed cardiac rehab, and he is interested in this. Also discussed radial site management, has occasional mild soreness.   Got a BP cuff, but hasn't gotten it to work well. His wife is taking it manually. Discussed options.   Has rare palpitations, no change with metoprolol. No syncope.  Past Medical History:  Diagnosis Date  . Asthma   . Erectile dysfunction   . Hyperlipidemia     Past Surgical History:  Procedure Laterality Date  . CORONARY STENT INTERVENTION N/A 02/23/2020   Procedure: CORONARY STENT INTERVENTION;  Surgeon: Martinique, Peter M, MD;  Location: [place] [facility];  Service: Cardiovascular;  Laterality: N/A;  . INTRAVASCULAR ULTRASOUND/IVUS N/A 02/23/2020   Procedure: Intravascular Ultrasound/IVUS;  Surgeon: Martinique, Peter M, MD;  Location: [place] [facility];  Service: Cardiovascular;  Laterality: N/A;  . LEFT HEART CATH AND CORONARY  ANGIOGRAPHY N/A 02/23/2020   Procedure: LEFT HEART CATH AND CORONARY ANGIOGRAPHY;  Surgeon: Martinique, Peter M, MD;  Location: [facility] [facility];  Service: Cardiovascular;  Laterality: N/A;  . UMBILICAL HERNIA REPAIR  1975    Current Medications: Current Outpatient Medications on File Prior to Visit  Medication Sig  . Artificial Tear Ointment (DRY EYES OP) Apply 1 drop to eye daily as needed (for dry eyes).  Marland Kitchen aspirin 81 MG EC tablet Take 1 tablet (81 mg total) by mouth daily. Swallow whole.  Marland Kitchen atorvastatin (LIPITOR) 80 MG tablet Take 1 tablet (80 mg total) by mouth daily.  . metoprolol tartrate (LOPRESSOR) 25 MG tablet Take 0.5 tablets (12.5 mg total) by mouth 2 (two) times daily.  . nitroGLYCERIN (NITROSTAT) 0.4 MG SL tablet Place 1 tablet (0.4 mg total) under the tongue every 5 (five) minutes as needed for chest pain.  . ticagrelor (BRILINTA) 90 MG TABS tablet Take 1 tablet (90 mg total) by mouth 2 (two) times daily.   No current facility-administered medications on file prior to visit.     Allergies:   Patient has no known allergies.   Social History   Tobacco Use  . Smoking status: Former Research scientist (life sciences)  . Smokeless tobacco: Never Used  Substance Use Topics  . Alcohol use: Not Currently  . Drug use: Not on file    Family History: family history includes Breast cancer in his father; CAD in his mother; Colon cancer in  his sister; Lung cancer in his father.  ROS:   Please see the history of present illness.  Additional pertinent ROS otherwise unremarkable.    EKGs/Labs/Other Studies Reviewed:    The following studies were reviewed today: Cath 02/23/20  Prox LAD to Mid LAD lesion is 40% stenosed.  Mid Cx lesion is 90% stenosed.  Post intervention, there is a 0% residual stenosis.  A drug-eluting stent was successfully placed using a STENT RESOLUTE ONYX 3.0X18.  The left ventricular systolic function is normal.  LV end diastolic pressure is normal.  The left ventricular  ejection fraction is 55-65% by visual estimate.   1. Single vessel obstructive CAD 2. Normal LV function 3. Normal LVEDP 4. Successful PCI of the mid LCx with DES x 1 and IVUS guidance.  Plan: DAPT for one year. Anticipate same day DC.   CTA with FFR 02/09/20 Coronary Arteries:  Normal coronary origin.  Right dominance.  RCA is a large dominant artery that gives rise to PDA and PLVB. There is no plaque.  Left main is a large artery that gives rise to LAD and LCX arteries. There is no plaque.  LAD is a large vessel that gives rise to a small D1 and a large branching D2. There is minimal calcified plaque in the proximal LAD with associated stenosis of 0-24%. There is moderate mixed plaque in the mid LAD with associated stenosis of 50-69%.  LCX is a non-dominant artery that gives rise to a small OM1 branch and large OM2 branch. There is mixed calcified plaque in the mid LCx with associated stenosis of 50-69% but possibly > 70%.  Other findings:  Normal pulmonary vein drainage into the left atrium.  Normal let atrial appendage without a thrombus.  Normal size of the pulmonary artery.  IMPRESSION: 1. Coronary calcium score of 127. This was 81st percentile for age and sex matched control.  2.  Normal coronary origin with right dominance.  3.  Moderate atherosclerosis.  CAD RADS 3.  4. Consider symptom-guided anti-ischemic and preventive pharmacotherapy as well as risk factor modification per guideline-directed care.  5.  This study has been submitted for FFR flow analysis.  FFR: 1. Left Main: No significant stenosis.  LM FFR = 0.99.  2. LAD: NO significant stenosis. Proximal FFR = 0.97, mid FFR = 0.80, Distal FFR = 0.72.  3. LCX: Possible flow limiting lesion in proximal to mid LCx. Proximal FFR = 1.00, Mid FFR = 0.61, Distal FFR = 0.52.  4. Ramus: No significant stenosis. Proximal FFR =, Mid FFR=, Distal FFR =.  5. RCA: No significant  stenosis. Proximal FFR = 0.99, Mid FFR = 0.96, Distal FFR = 0.93.  IMPRESSION: 1. Coronary CT FFR flow analysis demonstrates possible flow limiting lesion in the proximal to mid LCx and gradual decrease in flow in LAD likely related to small vessel disease.  2.  Recommend cardiac catheterization.   EKG:  EKG is personally reviewed.  The ekg ordered today demonstrates sinus bradycardia at 58 bpm  Recent Labs: 02/22/2020: BUN 15; Creatinine, Ser 0.97; Hemoglobin 15.9; Platelets 271; Potassium 4.0; Sodium 136  Recent Lipid Panel No results found for: CHOL, TRIG, HDL, CHOLHDL, VLDL, LDLCALC, LDLDIRECT  Physical Exam:    VS:  BP 112/70   Pulse (!) 58   Ht $R'5\' 9"'dF$  (1.753 m)   Wt 182 lb (82.6 kg)   SpO2 97%   BMI 26.88 kg/m     Wt Readings from Last 3 Encounters:  03/04/20 182 lb (  82.6 kg)  02/22/20 181 lb (82.1 kg)  01/18/20 181 lb (82.1 kg)    GEN: Well nourished, well developed in no acute distress HEENT: Normal, moist mucous membranes NECK: No JVD CARDIAC: regular rhythm, normal S1 and S2, no rubs or gallops. No murmur. VASCULAR: Radial and DP pulses 2+ bilaterally. No carotid bruits RESPIRATORY:  Clear to auscultation without rales, wheezing or rhonchi  ABDOMEN: Soft, non-tender, non-distended MUSCULOSKELETAL:  Ambulates independently SKIN: Warm and dry, no edema NEUROLOGIC:  Alert and oriented x 3. No focal neuro deficits noted. PSYCHIATRIC:  Normal affect   ASSESSMENT:    1. Coronary artery disease involving native heart without angina pectoris, unspecified vessel or lesion type   2. Heart palpitations   3. Status post coronary artery stent placement   4. Hyperlipidemia, unspecified hyperlipidemia type   5. Family history of heart disease   6. Cardiac risk counseling    PLAN:    CAD s/p PCI 02/2020: -diagnosed by CT cardiac, confirmed on cath -no angina currently -Aspirin 81 mg, ticagrelor 90 mg BID for one year -statin: atorvastatin 80 mg daily -LDL goal  <70, last LDL -antianginals: beta blocker, has PRN nitro -counseled on diet recommendations and AHA exercise guidelines, below -risk factor modification, as below -counseled on red flag warning signs that need immediate medical attention.  -recommended cardiac rehab, he will plan to do this  Palpitations: Related to stress/anxiety, not improved on metoprolol  Hyperlipidemia:  Per [facility], last outpatient labs 08/18/19 showed Tchol 157, HDL 43, LDL 99, TG 80 -recheck at follow up  Family history of heart disease Cardiac risk counseling and prevention recommendations: -recommend heart healthy/Mediterranean diet, with whole grains, fruits, vegetable, fish, lean meats, nuts, and olive oil. Limit salt. -recommend moderate walking, 3-5 times/week for 30-50 minutes each session. Aim for at least 150 minutes.week. Goal should be pace of 3 miles/hours, or walking 1.5 miles in 30 minutes -recommend avoidance of tobacco products. Avoid excess alcohol.  Plan for follow up: 3 mos  Buford Dresser, MD, PhD [place]  [facility]    Medication Adjustments/Labs and Tests Ordered: Current medicines are reviewed at length with the patient today.  Concerns regarding medicines are outlined above.  Orders Placed This Encounter  Procedures  . EKG 12-Lead   No orders of the defined types were placed in this encounter.   Patient Instructions  Medication Instructions:  Your Physician recommend you continue on your current medication as directed.    *If you need a refill on your cardiac medications before your next appointment, please call your pharmacy*   Lab Work: None   Testing/Procedures: None   Follow-Up: At [facility], you and your health needs are our priority.  As part of our continuing mission to provide you with exceptional heart care, we have created designated Provider Care Teams.  These Care Teams include your primary Cardiologist (physician) and Advanced Practice  Providers (APPs -  Physician Assistants and Nurse Practitioners) who all work together to provide you with the care you need, when you need it.  We recommend signing up for the patient portal called "MyChart".  Sign up information is provided on this After Visit Summary.  MyChart is used to connect with patients for Virtual Visits (Telemedicine).  Patients are able to view lab/test results, encounter notes, upcoming appointments, etc.  Non-urgent messages can be sent to your provider as well.   To learn more about what you can do with MyChart, go to [email or web address].    Your  next appointment:   3 month(s)  The format for your next appointment:   In Person  Provider:   Buford Dresser, MD       Signed, Buford Dresser, MD PhD 03/04/2020    [place]

## 2020-03-04 NOTE — Patient Instructions (Signed)

## 2020-03-09 ENCOUNTER — Telehealth: Payer: Self-pay | Admitting: Cardiology

## 2020-03-09 NOTE — Telephone Encounter (Signed)
Patient states that he has looked into [employer] brand vitamins and supplements and would like to know if from a cardiology standpoint if the omega-3 with Coq10 supplement would be helpful for patient. Patient states that the company is very reputable but wanted to check with cardiology to see if it would be safe for him. Patient also interested in the CX8 supplement which he states is used to help lower blood pressure and increase circulation. Patient states he is unsure about the CX8 as he does not have elevated blood pressure but wanted to see if there were any recommendations. Advised patient I would forward message to PharmD and Dr. Cristal Deer for review.

## 2020-03-09 NOTE — Telephone Encounter (Signed)
Advised patient of Kristin's (PharmD) advice.   "Omega -3 is most beneficial for lowering triglycerides, his most recent level was 80 (goal is < 150), so he really doesn't need this.   CoQ-10 is sometimes beneficial for patients who are having myalgias with statins, it can decrease the myalgias.  He's on atorvastatin 80 and if he's not having issues, he probably doesn't need any.   CX8 has something called Sea buckthorn in it (listed on 2 separate lines on the ingredients).  This can potentially slow the blood clotting process.  He's on aspirin and ticagrelore.  He should NOT use this product."  Patient verbalized understanding.

## 2020-03-09 NOTE — Telephone Encounter (Signed)
Patient is calling to ask questions about supplements. Please call to discuss

## 2020-03-09 NOTE — Telephone Encounter (Signed)
Omega -3 is most beneficial for lowering triglycerides, his most recent level was 80 (goal is < 150), so he really doesn't need this.   CoQ-10 is sometimes beneficial for patients who are having myalgias with statins, it can decrease the myalgias.  He's on atorvastatin 80 and if he's not having issues, he probably doesn't need any.   CX8 has something called Sea buckthorn in it (listed on 2 separate lines on the ingredients).  This can potentially slow the blood clotting process.  He's on aspirin and ticagrelore.  He should NOT use this product.

## 2020-03-15 ENCOUNTER — Telehealth (HOSPITAL_COMMUNITY): Payer: Self-pay

## 2020-03-15 ENCOUNTER — Encounter (HOSPITAL_COMMUNITY): Payer: Self-pay

## 2020-03-15 NOTE — Telephone Encounter (Signed)
Attempted to call patient in regards to Cardiac Rehab - LM on VM Mailed letter 

## 2020-03-16 DIAGNOSIS — F4322 Adjustment disorder with anxiety: Secondary | ICD-10-CM | POA: Diagnosis not present

## 2020-03-22 ENCOUNTER — Telehealth (HOSPITAL_COMMUNITY): Payer: Self-pay

## 2020-03-22 NOTE — Telephone Encounter (Signed)
No response from pt.  Closed referral  

## 2020-03-25 ENCOUNTER — Telehealth (HOSPITAL_COMMUNITY): Payer: Self-pay | Admitting: Family Medicine

## 2020-03-30 DIAGNOSIS — F4322 Adjustment disorder with anxiety: Secondary | ICD-10-CM | POA: Diagnosis not present

## 2020-04-06 ENCOUNTER — Emergency Department (HOSPITAL_COMMUNITY)
Admission: EM | Admit: 2020-04-06 | Discharge: 2020-04-06 | Disposition: A | Discharge status: Home / Self Care | Payer: BC Managed Care – PPO | Attending: Emergency Medicine | Admitting: Emergency Medicine

## 2020-04-06 ENCOUNTER — Encounter (HOSPITAL_COMMUNITY): Payer: Self-pay | Admitting: Emergency Medicine

## 2020-04-06 ENCOUNTER — Emergency Department (HOSPITAL_COMMUNITY): Payer: BC Managed Care – PPO

## 2020-04-06 ENCOUNTER — Telehealth: Payer: Self-pay | Admitting: Cardiology

## 2020-04-06 DIAGNOSIS — R42 Dizziness and giddiness: Secondary | ICD-10-CM | POA: Insufficient documentation

## 2020-04-06 DIAGNOSIS — J45909 Unspecified asthma, uncomplicated: Secondary | ICD-10-CM | POA: Insufficient documentation

## 2020-04-06 DIAGNOSIS — R079 Chest pain, unspecified: Secondary | ICD-10-CM | POA: Diagnosis not present

## 2020-04-06 DIAGNOSIS — R0789 Other chest pain: Secondary | ICD-10-CM | POA: Diagnosis not present

## 2020-04-06 DIAGNOSIS — I2510 Atherosclerotic heart disease of native coronary artery without angina pectoris: Secondary | ICD-10-CM | POA: Diagnosis not present

## 2020-04-06 DIAGNOSIS — Z87891 Personal history of nicotine dependence: Secondary | ICD-10-CM | POA: Insufficient documentation

## 2020-04-06 DIAGNOSIS — Z7982 Long term (current) use of aspirin: Secondary | ICD-10-CM | POA: Diagnosis not present

## 2020-04-06 DIAGNOSIS — Z955 Presence of coronary angioplasty implant and graft: Secondary | ICD-10-CM | POA: Diagnosis not present

## 2020-04-06 LAB — CBC
HCT: 45.3 % (ref 39.0–52.0)
Hemoglobin: 14.8 g/dL (ref 13.0–17.0)
MCH: 29.8 pg (ref 26.0–34.0)
MCHC: 32.7 g/dL (ref 30.0–36.0)
MCV: 91.3 fL (ref 80.0–100.0)
Platelets: 289 10*3/uL (ref 150–400)
RBC: 4.96 MIL/uL (ref 4.22–5.81)
RDW: 12.6 % (ref 11.5–15.5)
WBC: 6.2 10*3/uL (ref 4.0–10.5)
nRBC: 0 % (ref 0.0–0.2)

## 2020-04-06 LAB — BASIC METABOLIC PANEL
Anion gap: 11 (ref 5–15)
BUN: 16 mg/dL (ref 6–20)
CO2: 26 mmol/L (ref 22–32)
Calcium: 9 mg/dL (ref 8.9–10.3)
Chloride: 102 mmol/L (ref 98–111)
Creatinine, Ser: 0.87 mg/dL (ref 0.61–1.24)
GFR, Estimated: >60 mL/min (ref >60)
Glucose, Bld: 80 mg/dL (ref 70–99)
Potassium: 3.9 mmol/L (ref 3.5–5.1)
Sodium: 139 mmol/L (ref 135–145)

## 2020-04-06 LAB — TROPONIN I (HIGH SENSITIVITY)
Troponin I (High Sensitivity): 4 ng/L (ref <18)
Troponin I (High Sensitivity): 3 ng/L (ref <18)

## 2020-04-06 MED ORDER — SODIUM CHLORIDE 0.9 % IV BOLUS
500.0000 mL | Freq: Once | INTRAVENOUS | Status: AC
  Administered 2020-04-06: 500 mL via INTRAVENOUS

## 2020-04-06 MED ORDER — NITROGLYCERIN 0.4 MG SL SUBL
0.4000 mg | SUBLINGUAL_TABLET | Freq: Once | SUBLINGUAL | Status: AC
  Administered 2020-04-06: 0.4 mg via SUBLINGUAL
  Filled 2020-04-06: qty 1

## 2020-04-06 NOTE — ED Notes (Signed)
Pt states chest discomfort is gone after Nitro .4mg  SL X1

## 2020-04-06 NOTE — ED Notes (Signed)
Patient verbalized understanding of discharge instructions. Opportunity for questions and answers.  

## 2020-04-06 NOTE — Discharge Instructions (Addendum)
Please call your cardiologist tomorrow to discuss your ER visit.  If you continue having chest pain at home, you can take up to 3 nitroglycerin tablets, spaced 10 minutes apart.  If these do not relieve your chest pain, call 911 or come back to the emergency department.

## 2020-04-06 NOTE — Telephone Encounter (Signed)
Pt c/o of Chest Pain: STAT if CP now or developed within 24 hours  1. Are you having CP right now? Patient states he is experiencing the CP now, but it is mild   2. Are you experiencing any other symptoms (ex. SOB, nausea, vomiting, sweating)? No   3. How long have you been experiencing CP? For about 2 days, per patient  4. Is your CP continuous or coming and going? Per patient, the pain is low grade continuous   5. Have you taken Nitroglycerin? No  ?

## 2020-04-06 NOTE — ED Triage Notes (Signed)
Pt c/o L side CP onset 2 days ago, discomfort radiating down L arm, stent placed 11/21. Pt reports compliance with meds and exercise, pt reports excessive stress recently. Pt denies other ACS s/s

## 2020-04-06 NOTE — ED Provider Notes (Signed)
[facility] EMERGENCY DEPARTMENT Provider Note   CSN: [id number] Arrival date & time: 04/06/20  1354     History CC: Chest pain  Allen Burns is a 59 y.o. male with a history of coronary artery disease status post DES to lateral circumflex November 2021, follows with Dr. Cristal Deer, high cholesterol, present emerge department chest pain.  The patient had a stent placed on Feb 25 2020 for 80% stenosis to LCx (40% stenosis to LAD noted, no other significant vascular disease reported), discharged home on Brilinta, aspirin, metoprolol, which she has been compliant with, presents back with chest pain.  He reports onset approximate 2 to 3 days ago.  He describes a squeezing chest discomfort that occasionally radiates down his left arm.  He reports he is under significant stress recently with social family issues.  He denies any specific worsening of symptoms with exertion.  He reports perhaps some mild lightheadedness, but nothing significant.  Currently is 2 out of 10 intensity chest pressure.  He called his cardiology office today and was told to come the emergency department for evaluation.  He has not taken any nitroglycerin at home.  He does not smoke.  He does not have high blood pressure at baseline.  He has been started on metoprolol and takes 12.5 mg BID post-stent placement.     HPI     Past Medical History:  Diagnosis Date  . Asthma   . Erectile dysfunction   . Hyperlipidemia     Patient Active Problem List   Diagnosis Date Noted  . Chest pain 02/23/2020  . Unstable angina (HCC)   . Hyperlipidemia   . Erectile dysfunction     Past Surgical History:  Procedure Laterality Date  . CORONARY STENT INTERVENTION N/A 02/23/2020   Procedure: CORONARY STENT INTERVENTION;  Surgeon: Swaziland, Peter M, MD;  Location: [facility] INVASIVE [facility] LAB;  Service: Cardiovascular;  Laterality: N/A;  . INTRAVASCULAR ULTRASOUND/IVUS N/A 02/23/2020   Procedure: Intravascular Ultrasound/IVUS;   Surgeon: Swaziland, Peter M, MD;  Location: [facility] INVASIVE [facility] LAB;  Service: Cardiovascular;  Laterality: N/A;  . LEFT HEART CATH AND CORONARY ANGIOGRAPHY N/A 02/23/2020   Procedure: LEFT HEART CATH AND CORONARY ANGIOGRAPHY;  Surgeon: Swaziland, Peter M, MD;  Location: [facility] INVASIVE CV LAB;  Service: Cardiovascular;  Laterality: N/A;  . UMBILICAL HERNIA REPAIR  1975       Family History  Problem Relation Age of Onset  . CAD Mother        3V CABG in his 61s  . Lung cancer Father   . Breast cancer Father   . Colon cancer Sister        died age 86    Social History   Tobacco Use  . Smoking status: Former Games developer  . Smokeless tobacco: Never Used  Substance Use Topics  . Alcohol use: Not Currently  . Drug use: Never    Home Medications Prior to Admission medications   Medication Sig Start Date End Date Taking? Authorizing Provider  Artificial Tear Ointment (DRY EYES OP) Apply 1 drop to eye daily as needed (for dry eyes).    [provider]  aspirin 81 MG EC tablet Take 1 tablet (81 mg total) by mouth daily. Swallow whole. 02/24/20   Arty Baumgartner, NP  atorvastatin (LIPITOR) 80 MG tablet Take 1 tablet (80 mg total) by mouth daily. 02/23/20 02/22/21  Arty Baumgartner, NP  metoprolol tartrate (LOPRESSOR) 25 MG tablet Take 0.5 tablets (12.5 mg total) by mouth  2 (two) times daily. 02/23/20 02/22/21  Arty Baumgartner, NP  nitroGLYCERIN (NITROSTAT) 0.4 MG SL tablet Place 1 tablet (0.4 mg total) under the tongue every 5 (five) minutes as needed for chest pain. 02/23/20 02/22/21  Arty Baumgartner, NP  ticagrelor (BRILINTA) 90 MG TABS tablet Take 1 tablet (90 mg total) by mouth 2 (two) times daily. 02/23/20   Arty Baumgartner, NP    Allergies    Patient has no known allergies.  Review of Systems   Review of Systems  Constitutional: Negative for chills and fever.  Eyes: Negative for pain and visual disturbance.  Respiratory: Negative for cough and shortness of breath.    Cardiovascular: Positive for chest pain. Negative for palpitations.  Gastrointestinal: Negative for abdominal pain and vomiting.  Genitourinary: Negative for dysuria and hematuria.  Musculoskeletal: Negative for arthralgias and myalgias.  Skin: Negative for rash and wound.  Neurological: Negative for syncope and headaches.  All other systems reviewed and are negative.   Physical Exam Updated Vital Signs BP 104/63   Pulse (!) 50   Temp 97.8 F (36.6 C) (Oral)   Resp 13   Ht 5\' 9"  (1.753 m)   Wt 77.1 kg   SpO2 100%   BMI 25.10 kg/m   Physical Exam Vitals and nursing note reviewed.  Constitutional:      Appearance: He is well-developed and well-nourished.  HENT:     Head: Normocephalic and atraumatic.  Eyes:     Conjunctiva/sclera: Conjunctivae normal.  Cardiovascular:     Rate and Rhythm: Normal rate and regular rhythm.     Pulses: Normal pulses.  Pulmonary:     Effort: Pulmonary effort is normal. No respiratory distress.  Musculoskeletal:        General: No edema.     Cervical back: Neck supple.  Skin:    General: Skin is warm and dry.  Neurological:     General: No focal deficit present.     Mental Status: He is alert and oriented to person, place, and time.  Psychiatric:        Mood and Affect: Mood and affect and mood normal.        Behavior: Behavior normal.     ED Results / Procedures / Treatments   Labs (all labs ordered are listed, but only abnormal results are displayed) Labs Reviewed  BASIC METABOLIC PANEL  CBC  TROPONIN I (HIGH SENSITIVITY)  TROPONIN I (HIGH SENSITIVITY)    EKG EKG Interpretation  Date/Time:  Wednesday April 06 2020 14:25:38 EST Ventricular Rate:  69 PR Interval:  154 QRS Duration: 78 QT Interval:  378 QTC Calculation: 405 R Axis:   78 Text Interpretation: Normal sinus rhythm Early repolarization Normal ECG No STEMI Confirmed by 09[birth year]08-1982 [phone]) on 04/06/2020 5:27:56 PM   Radiology DG Chest Portable 1  View  Result Date: 04/06/2020 CLINICAL DATA:  Left-sided chest pain x2 days. EXAM: PORTABLE CHEST 1 VIEW COMPARISON:  None. FINDINGS: The heart size and mediastinal contours are within normal limits. Both lungs are clear. The visualized skeletal structures are unremarkable. IMPRESSION: No active disease. Electronically Signed   By: 04/08/2020 M.D.   On: 04/06/2020 18:57    Procedures Procedures (including critical care time)  Medications Ordered in ED Medications  nitroGLYCERIN (NITROSTAT) SL tablet 0.4 mg (0.4 mg Sublingual Given 04/06/20 1827)  sodium chloride 0.9 % bolus 500 mL (0 mLs Intravenous Stopped 04/06/20 1932)    ED Course  I have reviewed the triage vital  signs and the nursing notes.  Pertinent labs & imaging results that were available during my care of the patient were reviewed by me and considered in my medical decision making (see chart for details).  This patient presents to the Emergency Department with complaint of chest pain. This involves an extensive number of treatment options, and is a complaint that carries with it a high risk of complications and morbidity.  The differential diagnosis includes coronary vasospasm vs ACS vs Pneumothorax vs Reflux/Gastritis vs MSK pain vs Pneumonia vs other.  Based on his clinical history with cardiac stent approximately 5 weeks ago, but otherwise unremarkable coronary vessels, I suspect this may be coronary vasospasm.  I have a lower suspicion for acute occlusion with unremarkable delta troponins and a benign EKG at this time.  We trialed 1 dose of sublingual nitroglycerin is completely resolved his chest pain.    ECG per my interpretation shows NSR with no acute ischemic changes.  I ordered, reviewed, and interpreted labs, including BMP, CBC, Trop. There were no immediate, life-threatening emergencies found in this labwork.  The patient's troponin level was 4 -> 3 I ordered medication SL nitro for chest pain I ordered  imaging studies which included dg chest I independently visualized and interpreted imaging which showed no acute intrathoracic process and the monitor tracing which showed NSR Previous records obtained and reviewed showing cardiac cath and Nov 2021 hospital course  After the interventions stated above, I reevaluated the patient and found that they remained clinically stable.  Based on the patient's clinical exam, vital signs, risk factors, and ED testing, I felt that the patient's overall risk of life-threatening emergency such as ACS, PE, sepsis, or infection was low.  At this time, I felt the patient's presentation was most clinically consistent with coronary vasospasm, but explained to the patient that this evaluation was not a definitive diagnostic workup.  I discussed outpatient follow up with primary care provider, and provided specialist office number on the patient's discharge paper if a referral was deemed necessary.  Return precautions were discussed with the patient.  I felt the patient was clinically stable for discharge.   Clinical Course as of 04/06/20 2241  Wed Apr 06, 2020  1918  IMPRESSION: No active disease. [MT]  1918 CP free after 1 SL nitro.  I strongly suspect this may be coronary vasospasm in the setting of stent placement 5 weeks ago.  I have a lower suspicion for acute stent occlusion with no persistent symptoms, no EKG changes, and 2 - troponins.  Given that he is pain-free, I think is reasonable to discharge him home with close cardiology follow-up. [MT]    Clinical Course User Index [MT] Bronco Mcgrory, Kermit Balo, MD    Final Clinical Impression(s) / ED Diagnoses Final diagnoses:  Chest pain, unspecified type    Rx / DC Orders ED Discharge Orders    None       Terald Sleeper, MD 04/06/20 2241

## 2020-04-06 NOTE — Telephone Encounter (Signed)
Spoke to pt who report he's been experiencing on and off chest pain for the past few days. Pt report symptoms reoccurred this morning and hasn't stopped. He described it as a mild continuous pain that radiated to to his arm. Pt voiced he has been experiencing some stress recently and and his daughter just tried to commit suicide for the 3rd time.  Nurse advised pt to report to ER based on current symptoms. Pt verbalized understanding. Trish notified.

## 2020-04-27 DIAGNOSIS — F4322 Adjustment disorder with anxiety: Secondary | ICD-10-CM | POA: Diagnosis not present

## 2020-05-04 DIAGNOSIS — F4322 Adjustment disorder with anxiety: Secondary | ICD-10-CM | POA: Diagnosis not present

## 2020-05-07 DIAGNOSIS — Z20822 Contact with and (suspected) exposure to covid-19: Secondary | ICD-10-CM | POA: Diagnosis not present

## 2020-06-02 ENCOUNTER — Other Ambulatory Visit: Payer: Self-pay | Admitting: Cardiology

## 2020-06-02 NOTE — Telephone Encounter (Signed)
This is Dr. Christopher's pt 

## 2020-06-08 ENCOUNTER — Ambulatory Visit (INDEPENDENT_AMBULATORY_CARE_PROVIDER_SITE_OTHER): Payer: BC Managed Care – PPO | Admitting: Cardiology

## 2020-06-08 ENCOUNTER — Encounter: Payer: Self-pay | Admitting: Cardiology

## 2020-06-08 ENCOUNTER — Other Ambulatory Visit: Payer: Self-pay

## 2020-06-08 VITALS — BP 102/64 | HR 56 | Ht 69.0 in | Wt 183.0 lb

## 2020-06-08 DIAGNOSIS — I25118 Atherosclerotic heart disease of native coronary artery with other forms of angina pectoris: Secondary | ICD-10-CM | POA: Diagnosis not present

## 2020-06-08 DIAGNOSIS — Z955 Presence of coronary angioplasty implant and graft: Secondary | ICD-10-CM | POA: Diagnosis not present

## 2020-06-08 DIAGNOSIS — Z8249 Family history of ischemic heart disease and other diseases of the circulatory system: Secondary | ICD-10-CM | POA: Diagnosis not present

## 2020-06-08 DIAGNOSIS — E7800 Pure hypercholesterolemia, unspecified: Secondary | ICD-10-CM

## 2020-06-08 DIAGNOSIS — Z79899 Other long term (current) drug therapy: Secondary | ICD-10-CM | POA: Diagnosis not present

## 2020-06-08 DIAGNOSIS — Z7189 Other specified counseling: Secondary | ICD-10-CM

## 2020-06-08 LAB — LIPID PANEL
Chol/HDL Ratio: 2.6 ratio (ref 0.0–5.0)
Cholesterol, Total: 116 mg/dL (ref 100–199)
HDL: 44 mg/dL (ref >39)
LDL Chol Calc (NIH): 56 mg/dL (ref 0–99)
Triglycerides: 77 mg/dL (ref 0–149)
VLDL Cholesterol Cal: 16 mg/dL (ref 5–40)

## 2020-06-08 LAB — HEPATIC FUNCTION PANEL
ALT: 66 IU/L — ABNORMAL HIGH (ref 0–44)
AST: 32 IU/L (ref 0–40)
Albumin: 4.6 g/dL (ref 3.8–4.9)
Alkaline Phosphatase: 71 IU/L (ref 44–121)
Bilirubin Total: 1.3 mg/dL — ABNORMAL HIGH (ref 0.0–1.2)
Bilirubin, Direct: 0.29 mg/dL (ref 0.00–0.40)
Total Protein: 6.7 g/dL (ref 6.0–8.5)

## 2020-06-08 NOTE — Progress Notes (Signed)
Cardiology Office Note:    Date:  06/08/2020   ID:  Allen Burns, DOB December 21, 1960, MRN [id number]  PCP:  Farris Has, MD  Cardiologist:  Jodelle Red, MD  Referring MD: Farris Has, MD   CC: follow up  History of Present Illness:    Allen Burns is a 61 y.o. male with a hx of erectile dysfunction, former tobacco use, family history of heart disease, CAD s/p PCI 02/23/20 who is seen for follow up today. I initially met him 10/4/21as a new consult at the request of Farris Has, MD for the evaluation and management of exertional chest discomfort.  Today: Has a lot of family stress at home, but heart is doing well.   Has a fitbit now. Back to exercising. Running 5 miles, doing hot yoga several times/week. Watching diet, working on avoiding sugar.   Has not felt any pain/discomfort except on rare occasion that he misses his PM dose of medication (maybe 3-4 times total).   Denies chest pain, shortness of breath at rest or with normal exertion. No PND, orthopnea, LE edema or unexpected weight gain. No syncope.  Does bruise easily on DAPT but no bleeding issues. Due for labs today.  Past Medical History:  Diagnosis Date  . Asthma   . Erectile dysfunction   . Hyperlipidemia     Past Surgical History:  Procedure Laterality Date  . CORONARY STENT INTERVENTION N/A 02/23/2020   Procedure: CORONARY STENT INTERVENTION;  Surgeon: Swaziland, Peter M, MD;  Location: [facility] INVASIVE CV LAB;  Service: Cardiovascular;  Laterality: N/A;  . INTRAVASCULAR ULTRASOUND/IVUS N/A 02/23/2020   Procedure: Intravascular Ultrasound/IVUS;  Surgeon: Swaziland, Peter M, MD;  Location: [facility] INVASIVE CV LAB;  Service: Cardiovascular;  Laterality: N/A;  . LEFT HEART CATH AND CORONARY ANGIOGRAPHY N/A 02/23/2020   Procedure: LEFT HEART CATH AND CORONARY ANGIOGRAPHY;  Surgeon: Swaziland, Peter M, MD;  Location: [facility] INVASIVE CV LAB;  Service: Cardiovascular;  Laterality: N/A;  . UMBILICAL HERNIA REPAIR  1975    Current  Medications: Current Outpatient Medications on File Prior to Visit  Medication Sig  . aspirin 81 MG EC tablet Take 1 tablet (81 mg total) by mouth daily. Swallow whole.  Marland Kitchen atorvastatin (LIPITOR) 80 MG tablet Take 1 tablet (80 mg total) by mouth daily.  Marland Kitchen BRILINTA 90 MG TABS tablet TAKE 1 TABLET(90 MG) BY MOUTH TWICE DAILY  . metoprolol tartrate (LOPRESSOR) 25 MG tablet Take 0.5 tablets (12.5 mg total) by mouth 2 (two) times daily.  . nitroGLYCERIN (NITROSTAT) 0.4 MG SL tablet Place 1 tablet (0.4 mg total) under the tongue every 5 (five) minutes as needed for chest pain.   No current facility-administered medications on file prior to visit.     Allergies:   Patient has no known allergies.   Social History   Tobacco Use  . Smoking status: Former Games developer  . Smokeless tobacco: Never Used  Substance Use Topics  . Alcohol use: Not Currently  . Drug use: Never    Family History: family history includes Breast cancer in his father; CAD in his mother; Colon cancer in his sister; Lung cancer in his father.  ROS:   Please see the history of present illness.  Additional pertinent ROS otherwise unremarkable.    EKGs/Labs/Other Studies Reviewed:    The following studies were reviewed today: Cath 02/23/20  Prox LAD to Mid LAD lesion is 40% stenosed.  Mid Cx lesion is 90% stenosed.  Post intervention, there is a 0% residual stenosis.  A drug-eluting  stent was successfully placed using a STENT RESOLUTE ONYX 3.0X18.  The left ventricular systolic function is normal.  LV end diastolic pressure is normal.  The left ventricular ejection fraction is 55-65% by visual estimate.   1. Single vessel obstructive CAD 2. Normal LV function 3. Normal LVEDP 4. Successful PCI of the mid LCx with DES x 1 and IVUS guidance.  Plan: DAPT for one year. Anticipate same day DC.   CTA with FFR 02/09/20 Coronary Arteries:  Normal coronary origin.  Right dominance.  RCA is a large dominant artery  that gives rise to PDA and PLVB. There is no plaque.  Left main is a large artery that gives rise to LAD and LCX arteries. There is no plaque.  LAD is a large vessel that gives rise to a small D1 and a large branching D2. There is minimal calcified plaque in the proximal LAD with associated stenosis of 0-24%. There is moderate mixed plaque in the mid LAD with associated stenosis of 50-69%.  LCX is a non-dominant artery that gives rise to a small OM1 branch and large OM2 branch. There is mixed calcified plaque in the mid LCx with associated stenosis of 50-69% but possibly > 70%.  Other findings:  Normal pulmonary vein drainage into the left atrium.  Normal let atrial appendage without a thrombus.  Normal size of the pulmonary artery.  IMPRESSION: 1. Coronary calcium score of 127. This was 81st percentile for age and sex matched control.  2.  Normal coronary origin with right dominance.  3.  Moderate atherosclerosis.  CAD RADS 3.  4. Consider symptom-guided anti-ischemic and preventive pharmacotherapy as well as risk factor modification per guideline-directed care.  5.  This study has been submitted for FFR flow analysis.  FFR: 1. Left Main: No significant stenosis.  LM FFR = 0.99.  2. LAD: NO significant stenosis. Proximal FFR = 0.97, mid FFR = 0.80, Distal FFR = 0.72.  3. LCX: Possible flow limiting lesion in proximal to mid LCx. Proximal FFR = 1.00, Mid FFR = 0.61, Distal FFR = 0.52.  4. Ramus: No significant stenosis. Proximal FFR =, Mid FFR=, Distal FFR =.  5. RCA: No significant stenosis. Proximal FFR = 0.99, Mid FFR = 0.96, Distal FFR = 0.93.  IMPRESSION: 1. Coronary CT FFR flow analysis demonstrates possible flow limiting lesion in the proximal to mid LCx and gradual decrease in flow in LAD likely related to small vessel disease.  2.  Recommend cardiac catheterization.   EKG:  EKG is personally reviewed.  The ekg ordered 04/07/20  demonstrates sinus bradycardia at 58 bpm  Recent Labs: 04/06/2020: BUN 16; Creatinine, Ser 0.87; Hemoglobin 14.8; Platelets 289; Potassium 3.9; Sodium 139  Recent Lipid Panel No results found for: CHOL, TRIG, HDL, CHOLHDL, VLDL, LDLCALC, LDLDIRECT  Physical Exam:    VS:  BP 102/64 (BP Location: Left Arm, Patient Position: Sitting, Cuff Size: Normal)   Pulse (!) 56   Ht $R'5\' 9"'ok$  (1.753 m)   Wt 183 lb (83 kg)   BMI 27.02 kg/m     Wt Readings from Last 3 Encounters:  06/08/20 183 lb (83 kg)  04/06/20 170 lb (77.1 kg)  03/04/20 182 lb (82.6 kg)    GEN: Well nourished, well developed in no acute distress HEENT: Normal, moist mucous membranes NECK: No JVD CARDIAC: regular rhythm, normal S1 and S2, no rubs or gallops. No murmur. VASCULAR: Radial and DP pulses 2+ bilaterally. No carotid bruits RESPIRATORY:  Clear to auscultation without rales,  wheezing or rhonchi  ABDOMEN: Soft, non-tender, non-distended MUSCULOSKELETAL:  Ambulates independently SKIN: Warm and dry, no edema NEUROLOGIC:  Alert and oriented x 3. No focal neuro deficits noted. PSYCHIATRIC:  Normal affect   ASSESSMENT:    1. Coronary artery disease involving native coronary artery of native heart with other form of angina pectoris ([place])   2. Medication management   3. Status post coronary artery stent placement   4. Family history of heart disease   5. Pure hypercholesterolemia   6. Cardiac risk counseling   7. Counseling on health promotion and disease prevention    PLAN:    CAD s/p PCI 02/2020: -diagnosed by CT cardiac, confirmed on cath -no angina currently with exertion, only if he misses/is late on medication (rare) -Aspirin 81 mg, ticagrelor 90 mg BID for one year -statin: atorvastatin 80 mg daily -LDL goal <70, last LDL 99. Due for labs today, ordered -antianginals: beta blocker, has PRN nitro -counseled on diet recommendations and AHA exercise guidelines, below -risk factor modification, as  below -counseled on red flag warning signs that need immediate medical attention.   Hypercholesterolemia:  Per [facility], last outpatient labs 08/18/19 showed Tchol 157, HDL 43, LDL 99, TG 80 -recheck labs today, not fasting but minimal breakfast, most interested in LDL  Family history of heart disease Cardiac risk counseling and prevention recommendations: -recommend heart healthy/Mediterranean diet, with whole grains, fruits, vegetable, fish, lean meats, nuts, and olive oil. Limit salt. -recommend moderate walking, 3-5 times/week for 30-50 minutes each session. Aim for at least 150 minutes.week. Goal should be pace of 3 miles/hours, or walking 1.5 miles in 30 minutes -recommend avoidance of tobacco products. Avoid excess alcohol. -ASCVD risk score: The ASCVD Risk score Mikey Bussing DC Jr., et al., 2013) failed to calculate for the following reasons:   Cannot find a previous HDL lab   Cannot find a previous total cholesterol lab    Plan for follow up: 8 mos to discuss dropping to aspirin alone vs continued DAPT  Buford Dresser, MD, PhD [place]  [facility]    Medication Adjustments/Labs and Tests Ordered: Current medicines are reviewed at length with the patient today.  Concerns regarding medicines are outlined above.  Orders Placed This Encounter  Procedures  . Lipid panel  . Hepatic function panel   No orders of the defined types were placed in this encounter.   Patient Instructions  Medication Instructions:  Your Physician recommend you continue on your current medication as directed.    *If you need a refill on your cardiac medications before your next appointment, please call your pharmacy*   Lab Work: Your physician recommends lab work today (Lipid, LFT).   If you have labs (blood work) drawn today and your tests are completely normal, you will receive your results only by: Marland Kitchen MyChart Message (if you have MyChart) OR . A paper copy in the mail If you have any lab  test that is abnormal or we need to change your treatment, we will call you to review the results.   Testing/Procedures: None   Follow-Up: At [facility], you and your health needs are our priority.  As part of our continuing mission to provide you with exceptional heart care, we have created designated Provider Care Teams.  These Care Teams include your primary Cardiologist (physician) and Advanced Practice Providers (APPs -  Physician Assistants and Nurse Practitioners) who all work together to provide you with the care you need, when you need it.  We  recommend signing up for the patient portal called "MyChart".  Sign up information is provided on this After Visit Summary.  MyChart is used to connect with patients for Virtual Visits (Telemedicine).  Patients are able to view lab/test results, encounter notes, upcoming appointments, etc.  Non-urgent messages can be sent to your provider as well.   To learn more about what you can do with MyChart, go to [email or web address].    Your next appointment:   8 month(s)  The format for your next appointment:   In Person  Provider:   Buford Dresser, MD     Signed, Buford Dresser, MD PhD 06/08/2020    [place]

## 2020-06-08 NOTE — Patient Instructions (Signed)
Medication Instructions:  Your Physician recommend you continue on your current medication as directed.    *If you need a refill on your cardiac medications before your next appointment, please call your pharmacy*   Lab Work: Your physician recommends lab work today (Lipid, LFT).   If you have labs (blood work) drawn today and your tests are completely normal, you will receive your results only by: Marland Kitchen MyChart Message (if you have MyChart) OR . A paper copy in the mail If you have any lab test that is abnormal or we need to change your treatment, we will call you to review the results.   Testing/Procedures: None   Follow-Up: At [facility], you and your health needs are our priority.  As part of our continuing mission to provide you with exceptional heart care, we have created designated Provider Care Teams.  These Care Teams include your primary Cardiologist (physician) and Advanced Practice Providers (APPs -  Physician Assistants and Nurse Practitioners) who all work together to provide you with the care you need, when you need it.  We recommend signing up for the patient portal called "MyChart".  Sign up information is provided on this After Visit Summary.  MyChart is used to connect with patients for Virtual Visits (Telemedicine).  Patients are able to view lab/test results, encounter notes, upcoming appointments, etc.  Non-urgent messages can be sent to your provider as well.   To learn more about what you can do with MyChart, go to [email or web address].    Your next appointment:   8 month(s)  The format for your next appointment:   In Person  Provider:   Jodelle Red, MD

## 2020-06-12 ENCOUNTER — Encounter: Payer: Self-pay | Admitting: Cardiology

## 2020-06-19 ENCOUNTER — Encounter: Payer: Self-pay | Admitting: Cardiology

## 2020-06-22 DIAGNOSIS — F4322 Adjustment disorder with anxiety: Secondary | ICD-10-CM | POA: Diagnosis not present

## 2020-06-22 DIAGNOSIS — Z20822 Contact with and (suspected) exposure to covid-19: Secondary | ICD-10-CM | POA: Diagnosis not present

## 2020-07-15 ENCOUNTER — Telehealth: Payer: Self-pay | Admitting: Cardiology

## 2020-07-15 NOTE — Telephone Encounter (Signed)
Called patient, advised of message regarding medication.   Patient verbalized understanding.  We will call back once responded about his liver function concerns.

## 2020-07-15 NOTE — Telephone Encounter (Signed)
1 extra dose should not cause any liver issues persistent problems.  Okay to HOLD atorvastatin dose today 07/15/20, and resume taking 80mg  daily (as prescribed) on 07/16/20.

## 2020-07-15 NOTE — Telephone Encounter (Signed)
Pt c/o medication issue:  1. Name of Medication:  atorvastatin (LIPITOR) 80 MG tablet    2. How are you currently taking this medication (dosage and times per day)? Yes  3. Are you having a reaction (difficulty breathing--STAT)? Stomach feeling gassy, sob funny taste in mouth  4. What is your medication issue? Took double dose of medicine  Pt also wants to speak with you about his liver function, pt is unsure if he should take morning meds today  Pt c/o Shortness Of Breath: STAT if SOB developed within the last 24 hours or pt is noticeably SOB on the phone  1. Are you currently SOB (can you hear that pt is SOB on the phone)? No  2. How long have you been experiencing SOB? Few days  3. Are you SOB when sitting or when up moving around? constant  4. Are you currently experiencing any other symptoms? Funny taste in mouth, diarrhea, gassy

## 2020-07-15 NOTE — Telephone Encounter (Signed)
Lfts were only very slightly above normal range, not at a level to be concerned. If he is due to see his PCP in July, they can be rechecked at that time. Thanks.

## 2020-07-15 NOTE — Telephone Encounter (Signed)
Called patient, advised that he forgot to take his Atorvastatin on Wednesday, but accidentally took two doses of the Atorvastatin yesterday morning. He would like to know if it is okay for him to take it today or if he should wait- he feel since taking both doses his stomach feels a little funny, but he states he has been having stomach issues anyway the last day just increased that. He has been having loose stools, a funny taste in his mouth, feeling gassy. He denies cardiac issues other than feeling fatigued. He does mention that he wants to know from Dr.Christopher if he should be concerned about his liver function test from previous testing- he states he knows he was told to continue his medications but would like to know how bad is his liver function, what concerns with taking the medications he is on will this cause him, he does see his PCP on July 29th but would appreciate any feedback form Dr.Christopher.   Advised of lab work results but patient would like to hear from Dr.Christopher of his concerns.   Advised I would route a message to MD, and will route to PharmD to advise of medication concern.

## 2020-07-19 NOTE — Telephone Encounter (Signed)
Left message to call back  

## 2020-07-20 DIAGNOSIS — F4322 Adjustment disorder with anxiety: Secondary | ICD-10-CM | POA: Diagnosis not present

## 2020-07-26 NOTE — Telephone Encounter (Signed)
Left message to call back  

## 2020-07-26 NOTE — Telephone Encounter (Signed)
Pt updated with MD's recommendations and verbalized understanding.  

## 2020-07-26 NOTE — Telephone Encounter (Signed)
Pt is returning call to [facility] for this morning.

## 2020-08-02 DIAGNOSIS — Z20822 Contact with and (suspected) exposure to covid-19: Secondary | ICD-10-CM | POA: Diagnosis not present

## 2020-08-03 DIAGNOSIS — F4322 Adjustment disorder with anxiety: Secondary | ICD-10-CM | POA: Diagnosis not present

## 2020-08-10 ENCOUNTER — Other Ambulatory Visit: Payer: Self-pay | Admitting: Gastroenterology

## 2020-08-10 DIAGNOSIS — R1011 Right upper quadrant pain: Secondary | ICD-10-CM | POA: Diagnosis not present

## 2020-08-10 DIAGNOSIS — R748 Abnormal levels of other serum enzymes: Secondary | ICD-10-CM | POA: Diagnosis not present

## 2020-08-29 ENCOUNTER — Ambulatory Visit
Admission: RE | Admit: 2020-08-29 | Discharge: 2020-08-29 | Disposition: A | Discharge status: Home / Self Care | Payer: BC Managed Care – PPO | Source: Ambulatory Visit | Attending: Gastroenterology | Admitting: Gastroenterology

## 2020-08-29 DIAGNOSIS — K8080 Other cholelithiasis without obstruction: Secondary | ICD-10-CM | POA: Diagnosis not present

## 2020-08-29 DIAGNOSIS — R1011 Right upper quadrant pain: Secondary | ICD-10-CM

## 2020-09-14 DIAGNOSIS — F4322 Adjustment disorder with anxiety: Secondary | ICD-10-CM | POA: Diagnosis not present

## 2020-09-21 ENCOUNTER — Telehealth: Payer: Self-pay | Admitting: Cardiology

## 2020-09-21 MED ORDER — METOPROLOL TARTRATE 25 MG PO TABS: ORAL_TABLET | Freq: Two times a day (BID) | ORAL | 1 refills | Status: DC

## 2020-09-21 NOTE — Telephone Encounter (Signed)
   *  STAT* If patient is at the pharmacy, call can be transferred to refill team.   1. Which medications need to be refilled? (please list name of each medication and dose if known)   metoprolol tartrate (LOPRESSOR) 25 MG tablet    2. Which pharmacy/location (including street and city if local pharmacy) is medication to be sent to? [facility] [place], [place], [place] [address]  3. Do they need a 30 day or 90 day supply? 90 days   Pt took his last pill today, need refill today

## 2020-10-11 DIAGNOSIS — Z20822 Contact with and (suspected) exposure to covid-19: Secondary | ICD-10-CM | POA: Diagnosis not present

## 2020-10-11 DIAGNOSIS — U071 COVID-19: Secondary | ICD-10-CM | POA: Diagnosis not present

## 2020-10-31 DIAGNOSIS — Z111 Encounter for screening for respiratory tuberculosis: Secondary | ICD-10-CM | POA: Diagnosis not present

## 2020-11-18 DIAGNOSIS — I2510 Atherosclerotic heart disease of native coronary artery without angina pectoris: Secondary | ICD-10-CM | POA: Insufficient documentation

## 2020-11-18 DIAGNOSIS — I700 Atherosclerosis of aorta: Secondary | ICD-10-CM | POA: Insufficient documentation

## 2020-11-18 DIAGNOSIS — J45909 Unspecified asthma, uncomplicated: Secondary | ICD-10-CM | POA: Insufficient documentation

## 2020-11-18 DIAGNOSIS — K8020 Calculus of gallbladder without cholecystitis without obstruction: Secondary | ICD-10-CM | POA: Diagnosis not present

## 2020-11-25 DIAGNOSIS — R1011 Right upper quadrant pain: Secondary | ICD-10-CM | POA: Diagnosis not present

## 2020-11-25 DIAGNOSIS — R748 Abnormal levels of other serum enzymes: Secondary | ICD-10-CM | POA: Diagnosis not present

## 2020-12-05 ENCOUNTER — Encounter: Payer: Self-pay | Admitting: Family Medicine

## 2020-12-05 ENCOUNTER — Other Ambulatory Visit: Payer: Self-pay

## 2020-12-05 ENCOUNTER — Ambulatory Visit (INDEPENDENT_AMBULATORY_CARE_PROVIDER_SITE_OTHER): Payer: BC Managed Care – PPO | Admitting: Family Medicine

## 2020-12-05 VITALS — BP 101/59 | HR 60 | Temp 97.9°F | Ht 69.0 in | Wt 184.0 lb

## 2020-12-05 DIAGNOSIS — J4520 Mild intermittent asthma, uncomplicated: Secondary | ICD-10-CM

## 2020-12-05 DIAGNOSIS — I700 Atherosclerosis of aorta: Secondary | ICD-10-CM

## 2020-12-05 DIAGNOSIS — I25119 Atherosclerotic heart disease of native coronary artery with unspecified angina pectoris: Secondary | ICD-10-CM | POA: Diagnosis not present

## 2020-12-05 DIAGNOSIS — K8020 Calculus of gallbladder without cholecystitis without obstruction: Secondary | ICD-10-CM

## 2020-12-05 DIAGNOSIS — R748 Abnormal levels of other serum enzymes: Secondary | ICD-10-CM

## 2020-12-05 NOTE — Progress Notes (Signed)
New Patient Office Visit  Subjective:  Patient ID: Allen Burns, male    DOB: 1960-09-17  Age: 60 y.o. MRN: [id number]  CC:  Chief Complaint  Patient presents with   Establish Care    HPI Allen Burns presents to establish care.   He does have a history of known coronary artery disease.  He had a stent placed in November 2021 and was placed on aspirin and Brilinta and a statin.  He follows with Dr. Peter Martinique in fact he has a follow-up appointment coming up in the next couple of months.  He does exercise regularly.  He was also having some right-sided abdominal pain and was diagnosed with gallstones.  There was some differing opinions about whether or not he should get his gallbladder out.  But nonetheless he has changed his diet and really has not had any significant problems since then.  He also has a history of elevated liver enzymes.  Its been elevated for approximately 4 months.  He denies any alcohol use.  He has been eating mostly a vegetarian diet with seafood.  He did have an ultrasound of the abdomen done in May which was normal.  Followed by Dr. Arta Silence.  History of COVID-19.  Past Medical History:  Diagnosis Date   Asthma    Erectile dysfunction    Hyperlipidemia     Past Surgical History:  Procedure Laterality Date   CORONARY STENT INTERVENTION N/A 02/23/2020   Procedure: CORONARY STENT INTERVENTION;  Surgeon: Martinique, Peter M, MD;  Location: [place] CV LAB;  Service: Cardiovascular;  Laterality: N/A;   INTRAVASCULAR ULTRASOUND/IVUS N/A 02/23/2020   Procedure: Intravascular Ultrasound/IVUS;  Surgeon: Martinique, Peter M, MD;  Location: [facility] CV LAB;  Service: Cardiovascular;  Laterality: N/A;   LEFT HEART CATH AND CORONARY ANGIOGRAPHY N/A 02/23/2020   Procedure: LEFT HEART CATH AND CORONARY ANGIOGRAPHY;  Surgeon: Martinique, Peter M, MD;  Location: [employer] [facility] CV LAB;  Service: Cardiovascular;  Laterality: N/A;   UMBILICAL HERNIA REPAIR  1975    Family  History  Problem Relation Age of Onset   CAD Mother        3V CABG in his 69s   Cancer Mother    Lung cancer Father    Breast cancer Father    Heart disease Father    Colon cancer Sister        died age 42   Cancer Sister     Social History   Socioeconomic History   Marital status: Divorced    Spouse name: Not on file   Number of children: Not on file   Years of education: Not on file   Highest education level: Not on file  Occupational History   Not on file  Tobacco Use   Smoking status: Former    Packs/day: 1.00    Years: 27.00    Pack years: 27.00    Types: Cigarettes    Quit date: 09/05/1993    Years since quitting: 27.2   Smokeless tobacco: Never  Substance and Sexual Activity   Alcohol use: Not Currently    Comment: 27 years ago   Drug use: Not Currently    Types: Marijuana    Comment: 27 years ago   Sexual activity: Yes    Comment: unneeded  Other Topics Concern   Not on file  Social History Narrative   Not on file   Social Determinants of Health   Financial Resource Strain: Not on file  Food Insecurity:  Not on file  Transportation Needs: Not on file  Physical Activity: Not on file  Stress: Not on file  Social Connections: Not on file  Intimate Partner Violence: Not on file    ROS Review of Systems  Constitutional:  Negative for diaphoresis, fever and unexpected weight change.  HENT:  Negative for hearing loss, rhinorrhea, sneezing and tinnitus.   Eyes:  Negative for visual disturbance.  Respiratory:  Negative for cough and wheezing.   Cardiovascular:  Positive for chest pain. Negative for palpitations.  Gastrointestinal:  Negative for blood in stool, diarrhea, nausea and vomiting.  Genitourinary:  Negative for dysuria and penile discharge.  Musculoskeletal:  Negative for arthralgias and myalgias.  Skin:  Negative for rash.  Neurological:  Negative for headaches.  Hematological:  Negative for adenopathy. Bruises/bleeds easily.   Psychiatric/Behavioral:  Negative for dysphoric mood and sleep disturbance. The patient is not nervous/anxious.    Objective:   Today's Vitals: BP (!) 101/59   Pulse 60   Temp 97.9 F (36.6 C)   Ht 5\' 9"  (1.753 m)   Wt 184 lb (83.5 kg)   SpO2 99%   BMI 27.17 kg/m   Physical Exam Constitutional:      Appearance: He is well-developed.  HENT:     Head: Normocephalic and atraumatic.  Cardiovascular:     Rate and Rhythm: Normal rate and regular rhythm.     Heart sounds: Normal heart sounds.  Pulmonary:     Effort: Pulmonary effort is normal.     Breath sounds: Normal breath sounds.  Skin:    General: Skin is warm and dry.  Neurological:     Mental Status: He is alert and oriented to person, place, and time.  Psychiatric:        Behavior: Behavior normal.    Assessment & Plan:   Problem List Items Addressed This Visit       Cardiovascular and Mediastinum   Coronary artery disease    Stent placed in November 2021 should be able to come off of Brilinta and continue aspirin at that point.  We will follow-up with cardiology, Dr. Peter December 2021.  Will need to continue the atorvastatin in addition to healthy diet and regular exercise.      Atherosclerosis of abdominal aorta (HCC) - Primary    Discussed the importance of him continuing his statin indefinitely.        Respiratory   Asthma    Mostly impacted him as a child.  Rarely bothers him if he gets bronchitis.        Digestive   Gallstones    No active symptoms currently.        Other   Elevated liver enzymes    Does have a follow-up with GI coming up soon.  He has not really had any more abdominal pain.  He was started on a statin last fall so that certainly could be contributing he has no alcohol use.  She is also been eating more healthy so the fatty liver is less likely.  Hepatic Function Latest Ref Rng & Units 06/08/2020  Total Protein 6.0 - 8.5 g/dL 6.7  Albumin 3.8 - 4.9 g/dL 4.6  AST 0 - 40 IU/L 32   ALT 0 - 44 IU/L 66(H)  Alk Phosphatase 44 - 121 IU/L 71  Total Bilirubin 0.0 - 1.2 mg/dL 06/10/2020)  Bilirubin, Direct 0.00 - 0.40 mg/dL 5.5(T          Outpatient Encounter Medications as of 12/05/2020  Medication Sig   aspirin 81 MG EC tablet TAKE 1 TABLET (81 MG TOTAL) BY MOUTH DAILY. SWALLOW WHOLE.   atorvastatin (LIPITOR) 80 MG tablet TAKE 1 TABLET (80 MG TOTAL) BY MOUTH DAILY.   BRILINTA 90 MG TABS tablet TAKE 1 TABLET(90 MG) BY MOUTH TWICE DAILY   metoprolol tartrate (LOPRESSOR) 25 MG tablet TAKE 1/2 TABLET (12.5 MG TOTAL) BY MOUTH TWO TIMES DAILY.   nitroGLYCERIN (NITROSTAT) 0.4 MG SL tablet PLACE 1 TABLET (0.4 MG TOTAL) UNDER THE TONGUE EVERY FIVE MINUTES AS NEEDED FOR CHEST PAIN.   No facility-administered encounter medications on file as of 12/05/2020.    Follow-up: Return in about 6 months (around 06/07/2021).   Beatrice Lecher, MD

## 2020-12-05 NOTE — Assessment & Plan Note (Signed)
Does have a follow-up with GI coming up soon.  He has not really had any more abdominal pain.  He was started on a statin last fall so that certainly could be contributing he has no alcohol use.  She is also been eating more healthy so the fatty liver is less likely.  Hepatic Function Latest Ref Rng & Units 06/08/2020  Total Protein 6.0 - 8.5 g/dL 6.7  Albumin 3.8 - 4.9 g/dL 4.6  AST 0 - 40 IU/L 32  ALT 0 - 44 IU/L 66(H)  Alk Phosphatase 44 - 121 IU/L 71  Total Bilirubin 0.0 - 1.2 mg/dL 1.3(H)  Bilirubin, Direct 0.00 - 0.40 mg/dL 0.29

## 2020-12-05 NOTE — Assessment & Plan Note (Signed)
Stent placed in November 2021 should be able to come off of Brilinta and continue aspirin at that point.  We will follow-up with cardiology, Dr. Peter [place]aziland.  Will need to continue the atorvastatin in addition to healthy diet and regular exercise.

## 2020-12-05 NOTE — Assessment & Plan Note (Signed)
No active symptoms currently.

## 2020-12-05 NOTE — Assessment & Plan Note (Signed)
Discussed the importance of him continuing his statin indefinitely.

## 2020-12-05 NOTE — Assessment & Plan Note (Signed)
Mostly impacted him as a child.  Rarely bothers him if he gets bronchitis.

## 2020-12-05 NOTE — Patient Instructions (Signed)
Critical care medicine: Principles of diagnosis and management in the adult (4th ed., pp. 9371-6967). Saunders."> Miller's anesthesia (8th ed., pp. 232-250). Saunders.">  Advance Directive  Advance directives are legal documents that allow you to make decisions about your health care and medical treatment in case you become unable to communicate for yourself. Advance directives let your wishes be known to family, friends,and health care providers. Discussing and writing advance directives should happen over time rather than all at once. Advance directives can be changed and updated at any time. There are different types of advance directives, such as: Medical power of attorney. Living will. Do not resuscitate (DNR) order or do not attempt resuscitation (DNAR) order. Health care proxy and medical power of attorney A health care proxy is also called a health care agent. This person is appointed to make medical decisions for you when you are unable to make decisions for yourself. Generally, people ask a trusted friend or family member to act as their proxy and represent their preferences. Make sure you have an agreement with your trusted person to act as your proxy. A proxy may have tomake a medical decision on your behalf if your wishes are not known. A medical power of attorney, also called a durable power of attorney for health care, is a legal document that names your health care proxy. Depending on the laws in your state, the document may need to be: Signed. Notarized. Dated. Copied. Witnessed. Incorporated into your medical record. You may also want to appoint a trusted person to manage your money in the event you are unable to do so. This is called a durable power of attorney for finances. It is a separate legal document from the durable power of attorney for health care. You may choose your health care proxy or someone different toact as your agent in money matters. If you do not appoint a  proxy, or there is a concern that the proxy is not acting in your best interest, a court may appoint a guardian to act on yourbehalf. Living will A living will is a set of instructions that state your wishes about medical care when you cannot express them yourself. Health care providers should keep a copy of your living will in your medical record. You may want to give a copy to family members or friends. To alert caregivers in case of an emergency, you can place a card in your wallet to let them know that you have a living will and where they can find it. A living will is used if you become: Terminally ill. Disabled. Unable to communicate or make decisions. The following decisions should be included in your living will: To use or not to use life support equipment, such as dialysis machines and breathing machines (ventilators). Whether you want a DNR or DNAR order. This tells health care providers not to use cardiopulmonary resuscitation (CPR) if breathing or heartbeat stops. To use or not to use tube feeding. To be given or not to be given food and fluids. Whether you want comfort (palliative) care when the goal becomes comfort rather than a cure. Whether you want to donate your organs and tissues. A living will does not give instructions for distributing your money andproperty if you should pass away. DNR or DNAR A DNR or DNAR order is a request not to have CPR in the event that your heart stops beating or you stop breathing. If a DNR or DNAR order has not been made and shared,  a health care provider will try to help any patient whose heart has stopped or who has stopped breathing. If you plan to have surgery, talk with your health care provider about how your DNR or DNAR order will be followed ifproblems occur. What if I do not have an advance directive? Some states assign family decision makers to act on your behalf if you do not have an advance directive. Each state has its own laws about  advance directives. You may want to check with your health care provider, attorney, orstate representative about the laws in your state. Summary Advance directives are legal documents that allow you to make decisions about your health care and medical treatment in case you become unable to communicate for yourself. The process of discussing and writing advance directives should happen over time. You can change and update advance directives at any time. Advance directives may include a medical power of attorney, a living will, and a DNR or DNAR order. This information is not intended to replace advice given to you by your health care provider. Make sure you discuss any questions you have with your healthcare provider. Document Revised: 01/05/2020 Document Reviewed: 01/05/2020 Elsevier Patient Education  2022 Elsevier Inc.  

## 2020-12-28 DIAGNOSIS — F4322 Adjustment disorder with anxiety: Secondary | ICD-10-CM | POA: Diagnosis not present

## 2021-02-01 DIAGNOSIS — R1012 Left upper quadrant pain: Secondary | ICD-10-CM | POA: Diagnosis not present

## 2021-02-01 DIAGNOSIS — R748 Abnormal levels of other serum enzymes: Secondary | ICD-10-CM | POA: Diagnosis not present

## 2021-02-20 NOTE — Progress Notes (Signed)
Cardiology Office Note:    Date:  02/22/2021   ID:  Allen Burns, DOB 02/21/1961, MRN [id number]  PCP:  Hali Marry, MD  Cardiologist:  Buford Dresser, MD  Referring MD: London Pepper, MD   CC: follow up  History of Present Illness:    Allen Burns is a 60 y.o. male with a hx of erectile dysfunction, former tobacco use, family history of heart disease, CAD s/p PCI 02/23/20 who is seen for follow up today. I initially met him 10/4/21as a new consult at the request of London Pepper, MD for the evaluation and management of exertional chest discomfort.  Today: Overall, he is not feeling great. From time to time, his "heart hurts." Right now, he is feeling chest pain that may occur either in his left or right chest. Once or twice the pain radiated to his right arm distally. He describes this as a throbbing pain, but also more like a sharpness than a dullness. The pain is reproducible when he pushes into his chest. He has noticed that his chest pain is worse with stress or poor diet. For one episode at work he felt he needed to take nitroglycerin, but unfortunately did not have it with him.  Every 6 months he feels a "flutter" that he attributes to anxiety.  Lately he continues to exercise regularly including yoga and running. However, he is feeling left-sided pain lateral to his abdomen when he bends over. On 11/18/2020 he was dx with calculus of gallbladder without cholecystitis or obstruction.  Concerning his diet he has some difficulty with eating too many sweets, but believes he is improving. He has been trying to avoid candy and coffee. Also has been eating more vegetables.  Usually he is compliant with his medications, but may miss a dose once a week.  Years ago he confirms seeing a Restaurant manager, fast food. He endorses chronic neck stiffness due to a vertebral disc issue.  He denies any shortness of breath, lightheadedness, headaches, syncope, orthopnea, PND, or lower extremity  edema.  Past Medical History:  Diagnosis Date   Asthma    Erectile dysfunction    Hyperlipidemia     Past Surgical History:  Procedure Laterality Date   CORONARY STENT INTERVENTION N/A 02/23/2020   Procedure: CORONARY STENT INTERVENTION;  Surgeon: Martinique, Peter M, MD;  Location: [facility];  Service: Cardiovascular;  Laterality: N/A;   INTRAVASCULAR ULTRASOUND/IVUS N/A 02/23/2020   Procedure: Intravascular Ultrasound/IVUS;  Surgeon: Martinique, Peter M, MD;  Location: [facility];  Service: Cardiovascular;  Laterality: N/A;   LEFT HEART CATH AND CORONARY ANGIOGRAPHY N/A 02/23/2020   Procedure: LEFT HEART CATH AND CORONARY ANGIOGRAPHY;  Surgeon: Martinique, Peter M, MD;  Location: [facility];  Service: Cardiovascular;  Laterality: N/A;   UMBILICAL HERNIA REPAIR  1975    Current Medications: Current Outpatient Medications on File Prior to Visit  Medication Sig   aspirin 81 MG EC tablet TAKE 1 TABLET (81 MG TOTAL) BY MOUTH DAILY. SWALLOW WHOLE.   atorvastatin (LIPITOR) 80 MG tablet TAKE 1 TABLET (80 MG TOTAL) BY MOUTH DAILY.   nitroGLYCERIN (NITROSTAT) 0.4 MG SL tablet PLACE 1 TABLET (0.4 MG TOTAL) UNDER THE TONGUE EVERY FIVE MINUTES AS NEEDED FOR CHEST PAIN.   No current facility-administered medications on file prior to visit.     Allergies:   Patient has no known allergies.   Social History   Tobacco Use   Smoking status: Former    Packs/day: 1.00    Years:  27.00    Pack years: 27.00    Types: Cigarettes    Quit date: 09/05/1993    Years since quitting: 27.4   Smokeless tobacco: Never  Substance Use Topics   Alcohol use: Not Currently    Comment: 27 years ago   Drug use: Not Currently    Types: Marijuana    Comment: 27 years ago    Family History: family history includes Breast cancer in his father; CAD in his mother; Cancer in his mother and sister; Colon cancer in his sister; Heart disease in his father; Lung cancer in his father.  ROS:   Please  see the history of present illness.   (+) Chest pain (+) Palpitations (+) Anxiety (+) Stress (+) Left-sided pain (+) Chronic neck stiffness Additional pertinent ROS otherwise unremarkable.    EKGs/Labs/Other Studies Reviewed:    The following studies were reviewed today:  Cath 02/23/20 Prox LAD to Mid LAD lesion is 40% stenosed. Mid Cx lesion is 90% stenosed. Post intervention, there is a 0% residual stenosis. A drug-eluting stent was successfully placed using a STENT RESOLUTE ONYX 3.0X18. The left ventricular systolic function is normal. LV end diastolic pressure is normal. The left ventricular ejection fraction is 55-65% by visual estimate.   1. Single vessel obstructive CAD 2. Normal LV function 3. Normal LVEDP 4. Successful PCI of the mid LCx with DES x 1 and IVUS guidance.   Plan: DAPT for one year. Anticipate same day DC.   CTA with FFR 02/09/20 Coronary Arteries:  Normal coronary origin.  Right dominance.   RCA is a large dominant artery that gives rise to PDA and PLVB. There is no plaque.   Left main is a large artery that gives rise to LAD and LCX arteries. There is no plaque.   LAD is a large vessel that gives rise to a small D1 and a large branching D2. There is minimal calcified plaque in the proximal LAD with associated stenosis of 0-24%. There is moderate mixed plaque in the mid LAD with associated stenosis of 50-69%.   LCX is a non-dominant artery that gives rise to a small OM1 branch and large OM2 branch. There is mixed calcified plaque in the mid LCx with associated stenosis of 50-69% but possibly > 70%.   Other findings:   Normal pulmonary vein drainage into the left atrium.   Normal let atrial appendage without a thrombus.   Normal size of the pulmonary artery.   IMPRESSION: 1. Coronary calcium score of 127. This was 81st percentile for age and sex matched control.   2.  Normal coronary origin with right dominance.   3.  Moderate  atherosclerosis.  CAD RADS 3.   4. Consider symptom-guided anti-ischemic and preventive pharmacotherapy as well as risk factor modification per guideline-directed care.   5.  This study has been submitted for FFR flow analysis.  FFR: 1. Left Main: No significant stenosis.  LM FFR = 0.99.   2. LAD: NO significant stenosis. Proximal FFR = 0.97, mid FFR = 0.80, Distal FFR = 0.72.   3. LCX: Possible flow limiting lesion in proximal to mid LCx. Proximal FFR = 1.00, Mid FFR = 0.61, Distal FFR = 0.52.   4. Ramus: No significant stenosis. Proximal FFR =, Mid FFR=, Distal FFR =.   5. RCA: No significant stenosis. Proximal FFR = 0.99, Mid FFR = 0.96, Distal FFR = 0.93.   IMPRESSION: 1. Coronary CT FFR flow analysis demonstrates possible flow limiting lesion in the  proximal to mid LCx and gradual decrease in flow in LAD likely related to small vessel disease.   2.  Recommend cardiac catheterization.   EKG:  EKG is personally reviewed.   02/22/2021: sinus bradycardia at 57 bpm, early repol 04/07/20: sinus bradycardia at 58 bpm  Recent Labs: 04/06/2020: BUN 16; Creatinine, Ser 0.87; Hemoglobin 14.8; Platelets 289; Potassium 3.9; Sodium 139 06/08/2020: ALT 66   Recent Lipid Panel    Component Value Date/Time   CHOL 116 06/08/2020 0922   TRIG 77 06/08/2020 0922   HDL 44 06/08/2020 0922   CHOLHDL 2.6 06/08/2020 0922   LDLCALC 56 06/08/2020 0922    Physical Exam:    VS:  BP 110/74 (BP Location: Right Arm, Patient Position: Sitting, Cuff Size: Normal)   Pulse (!) 57   Ht _0  (1.753 m)   Wt 187 lb 4.8 oz (85 kg)   SpO2 97%   BMI 27.66 kg/m     Wt Readings from Last 3 Encounters:  02/22/21 187 lb 4.8 oz (85 kg)  12/05/20 184 lb (83.5 kg)  06/08/20 183 lb (83 kg)    GEN: Well nourished, well developed in no acute distress HEENT: Normal, moist mucous membranes NECK: No JVD CARDIAC: regular rhythm, normal S1 and S2, no rubs or gallops. No murmur. VASCULAR: Radial and DP  pulses 2+ bilaterally. No carotid bruits RESPIRATORY:  Clear to auscultation without rales, wheezing or rhonchi  ABDOMEN: Soft, non-tender, non-distended MUSCULOSKELETAL:  Ambulates independently SKIN: Warm and dry, no edema NEUROLOGIC:  Alert and oriented x 3. No focal neuro deficits noted. PSYCHIATRIC:  Normal affect   ASSESSMENT:    1. Coronary artery disease involving native coronary artery of native heart with other form of angina pectoris ([place])   2. Status post coronary artery stent placement   3. Family history of heart disease   4. Pure hypercholesterolemia   5. Cardiac risk counseling     PLAN:    CAD s/p PCI 02/2020 Hypercholesterolemia -diagnosed by CT cardiac, confirmed on cath -has atypical pain, tender to touch, sharp, across all of chest and occasionally arms. -Aspirin 81 mg long term -ticagrelor 90 mg BID for one year, discussed, will stop today -statin: atorvastatin 80 mg daily -LDL goal <70, last LDL 56 -antianginals: stopping beta blocker, has PRN nitro -counseled on diet recommendations and AHA exercise guidelines, below -risk factor modification, as below -counseled on red flag warning signs that need immediate medical attention.   Family history of heart disease Cardiac risk counseling and prevention recommendations: -recommend heart healthy/Mediterranean diet, with whole grains, fruits, vegetable, fish, lean meats, nuts, and olive oil. Limit salt. -recommend moderate walking, 3-5 times/week for 30-50 minutes each session. Aim for at least 150 minutes.week. Goal should be pace of 3 miles/hours, or walking 1.5 miles in 30 minutes -recommend avoidance of tobacco products. Avoid excess alcohol.  Plan for follow up: 1 year or sooner as needed.  Buford Dresser, MD, PhD [place]  [facility]    Medication Adjustments/Labs and Tests Ordered: Current medicines are reviewed at length with the patient today.  Concerns regarding medicines are  outlined above.   Orders Placed This Encounter  Procedures   EKG 12-Lead    No orders of the defined types were placed in this encounter.  Patient Instructions  Medication Instructions:  -Finish your brilinta (ticagrelor) then stop  -Take your metoprolol in the morning only for one week, then stop completely.  -If you feel better on the metoprolol, let me  know and we will change it to a once daily dose.  -Continue the aspirin and atorvastatin long term.  *If you need a refill on your cardiac medications before your next appointment, please call your pharmacy*   Lab Work: None ordered today   Testing/Procedures: None ordered today   Follow-Up: At [facility], you and your health needs are our priority.  As part of our continuing mission to provide you with exceptional heart care, we have created designated Provider Care Teams.  These Care Teams include your primary Cardiologist (physician) and Advanced Practice Providers (APPs -  Physician Assistants and Nurse Practitioners) who all work together to provide you with the care you need, when you need it.  We recommend signing up for the patient portal called "MyChart".  Sign up information is provided on this After Visit Summary.  MyChart is used to connect with patients for Virtual Visits (Telemedicine).  Patients are able to view lab/test results, encounter notes, upcoming appointments, etc.  Non-urgent messages can be sent to your provider as well.   To learn more about what you can do with MyChart, go to [email or web address].    Your next appointment:   1 year(s)  The format for your next appointment:   In Person  Provider:   Buford Dresser, MD{      I,Mathew Stumpf,acting as a scribe for Buford Dresser, MD.,have documented all relevant documentation on the behalf of Buford Dresser, MD,as directed by  Buford Dresser, MD while in the presence of Buford Dresser, MD.  I,  Buford Dresser, MD, have reviewed all documentation for this visit. The documentation on 02/22/21 for the exam, diagnosis, procedures, and orders are all accurate and complete.   Signed, Buford Dresser, MD PhD 02/22/2021    [place]

## 2021-02-22 ENCOUNTER — Other Ambulatory Visit: Payer: Self-pay

## 2021-02-22 ENCOUNTER — Ambulatory Visit (HOSPITAL_BASED_OUTPATIENT_CLINIC_OR_DEPARTMENT_OTHER): Payer: BC Managed Care – PPO | Admitting: Cardiology

## 2021-02-22 ENCOUNTER — Encounter (HOSPITAL_BASED_OUTPATIENT_CLINIC_OR_DEPARTMENT_OTHER): Payer: Self-pay | Admitting: Cardiology

## 2021-02-22 VITALS — BP 110/74 | HR 57 | Ht 69.0 in | Wt 187.3 lb

## 2021-02-22 DIAGNOSIS — I25118 Atherosclerotic heart disease of native coronary artery with other forms of angina pectoris: Secondary | ICD-10-CM | POA: Diagnosis not present

## 2021-02-22 DIAGNOSIS — E7800 Pure hypercholesterolemia, unspecified: Secondary | ICD-10-CM

## 2021-02-22 DIAGNOSIS — Z7189 Other specified counseling: Secondary | ICD-10-CM

## 2021-02-22 DIAGNOSIS — Z8249 Family history of ischemic heart disease and other diseases of the circulatory system: Secondary | ICD-10-CM | POA: Diagnosis not present

## 2021-02-22 DIAGNOSIS — Z955 Presence of coronary angioplasty implant and graft: Secondary | ICD-10-CM

## 2021-02-22 NOTE — Patient Instructions (Signed)
Medication Instructions:  -Finish your brilinta (ticagrelor) then stop  -Take your metoprolol in the morning only for one week, then stop completely.  -If you feel better on the metoprolol, let me know and we will change it to a once daily dose.  -Continue the aspirin and atorvastatin long term.  *If you need a refill on your cardiac medications before your next appointment, please call your pharmacy*   Lab Work: None ordered today   Testing/Procedures: None ordered today   Follow-Up: At [facility], you and your health needs are our priority.  As part of our continuing mission to provide you with exceptional heart care, we have created designated Provider Care Teams.  These Care Teams include your primary Cardiologist (physician) and Advanced Practice Providers (APPs -  Physician Assistants and Nurse Practitioners) who all work together to provide you with the care you need, when you need it.  We recommend signing up for the patient portal called "MyChart".  Sign up information is provided on this After Visit Summary.  MyChart is used to connect with patients for Virtual Visits (Telemedicine).  Patients are able to view lab/test results, encounter notes, upcoming appointments, etc.  Non-urgent messages can be sent to your provider as well.   To learn more about what you can do with MyChart, go to [email or web address].    Your next appointment:   1 year(s)  The format for your next appointment:   In Person  Provider:   Jodelle Red, MD{

## 2021-02-24 ENCOUNTER — Encounter: Payer: Self-pay | Admitting: Family Medicine

## 2021-02-24 ENCOUNTER — Other Ambulatory Visit: Payer: Self-pay | Admitting: Family Medicine

## 2021-02-24 ENCOUNTER — Telehealth: Payer: Self-pay | Admitting: Family Medicine

## 2021-02-24 DIAGNOSIS — B009 Herpesviral infection, unspecified: Secondary | ICD-10-CM | POA: Insufficient documentation

## 2021-02-24 NOTE — Telephone Encounter (Signed)
Please see MyChart note sent to patient.    02/24/21

## 2021-02-24 NOTE — Progress Notes (Signed)
Abstraction performed.

## 2021-03-06 ENCOUNTER — Other Ambulatory Visit: Payer: Self-pay | Admitting: Cardiology

## 2021-03-08 DIAGNOSIS — F4322 Adjustment disorder with anxiety: Secondary | ICD-10-CM | POA: Diagnosis not present

## 2021-04-03 DIAGNOSIS — M545 Low back pain, unspecified: Secondary | ICD-10-CM | POA: Diagnosis not present

## 2021-04-04 ENCOUNTER — Telehealth: Payer: Self-pay | Admitting: *Deleted

## 2021-04-04 NOTE — Telephone Encounter (Signed)
Tiffany called and wanted Dr. Linford Arnold to know that Dr. Dulce Sellar felt that his issues were not GI related he feels that they were musculoskeletal.

## 2021-04-05 DIAGNOSIS — F4322 Adjustment disorder with anxiety: Secondary | ICD-10-CM | POA: Diagnosis not present

## 2021-04-07 DIAGNOSIS — S39012A Strain of muscle, fascia and tendon of lower back, initial encounter: Secondary | ICD-10-CM | POA: Diagnosis not present

## 2021-05-17 DIAGNOSIS — S39012A Strain of muscle, fascia and tendon of lower back, initial encounter: Secondary | ICD-10-CM | POA: Diagnosis not present

## 2021-05-24 DIAGNOSIS — S39012D Strain of muscle, fascia and tendon of lower back, subsequent encounter: Secondary | ICD-10-CM | POA: Diagnosis not present

## 2021-05-26 DIAGNOSIS — Z20822 Contact with and (suspected) exposure to covid-19: Secondary | ICD-10-CM | POA: Diagnosis not present

## 2021-05-31 DIAGNOSIS — S39012D Strain of muscle, fascia and tendon of lower back, subsequent encounter: Secondary | ICD-10-CM | POA: Diagnosis not present

## 2021-06-02 DIAGNOSIS — S39012D Strain of muscle, fascia and tendon of lower back, subsequent encounter: Secondary | ICD-10-CM | POA: Diagnosis not present

## 2021-06-05 DIAGNOSIS — S39012D Strain of muscle, fascia and tendon of lower back, subsequent encounter: Secondary | ICD-10-CM | POA: Diagnosis not present

## 2021-06-07 DIAGNOSIS — S39012D Strain of muscle, fascia and tendon of lower back, subsequent encounter: Secondary | ICD-10-CM | POA: Diagnosis not present

## 2021-06-12 ENCOUNTER — Other Ambulatory Visit: Payer: Self-pay

## 2021-06-12 ENCOUNTER — Ambulatory Visit (INDEPENDENT_AMBULATORY_CARE_PROVIDER_SITE_OTHER): Payer: BC Managed Care – PPO | Admitting: Family Medicine

## 2021-06-12 ENCOUNTER — Encounter: Payer: Self-pay | Admitting: Family Medicine

## 2021-06-12 VITALS — BP 125/81 | HR 63 | Wt 194.0 lb

## 2021-06-12 DIAGNOSIS — I700 Atherosclerosis of aorta: Secondary | ICD-10-CM | POA: Diagnosis not present

## 2021-06-12 DIAGNOSIS — R748 Abnormal levels of other serum enzymes: Secondary | ICD-10-CM

## 2021-06-12 DIAGNOSIS — R17 Unspecified jaundice: Secondary | ICD-10-CM | POA: Diagnosis not present

## 2021-06-12 DIAGNOSIS — K8020 Calculus of gallbladder without cholecystitis without obstruction: Secondary | ICD-10-CM

## 2021-06-12 NOTE — Assessment & Plan Note (Signed)
No recent or active abdominal pain.

## 2021-06-12 NOTE — Progress Notes (Signed)
Established Patient Office Visit  Subjective:  Patient ID: Allen Burns, male    DOB: 05-30-1960  Age: 61 y.o. MRN: [id number]  CC:  Chief Complaint  Patient presents with   Hyperlipidemia    HPI Allen Burns presents for   Hyperlipidemia - tolerating stating well with no myalgias or significant side effects.  Lab Results  Component Value Date   CHOL 116 06/08/2020   HDL 44 06/08/2020   LDLCALC 56 06/08/2020   TRIG 77 06/08/2020   CHOLHDL 2.6 06/08/2020    Follow back up with cardiology and they took him off the Brilinta and the metoprolol he is actually been doing well since then.  He does report some intermittent chest discomfort but has discussed it with cardiology.  They feel like it is probably more anxiety related.  Though he does not feel particularly anxious.  Does feel like he has been getting over a cold that he had for almost 3 weeks now.  Physically feels good but still has a little residual cough and nasal congestion.  They have been following elevated liver function.  At one point he was found to have some gallbladder stones and was having some pain but that seems to have resolved and not returned.  He was started on a statin last fall and that certainly could be contributing to a mild bump in liver function.  He denies alcohol use.  Last liver function was in February 2022.  Total bilirubin was slightly elevated at 1.3 and ALT was mildly elevated at 66.  AST was normal.  Past Medical History:  Diagnosis Date   Asthma    Erectile dysfunction    Hyperlipidemia     Past Surgical History:  Procedure Laterality Date   CORONARY STENT INTERVENTION N/A 02/23/2020   Procedure: CORONARY STENT INTERVENTION;  Surgeon: Swaziland, Peter M, MD;  Location: [facility] INVASIVE CV LAB;  Service: Cardiovascular;  Laterality: N/A;   INTRAVASCULAR ULTRASOUND/IVUS N/A 02/23/2020   Procedure: Intravascular Ultrasound/IVUS;  Surgeon: Swaziland, Peter M, MD;  Location: [facility] INVASIVE CV LAB;   Service: Cardiovascular;  Laterality: N/A;   LEFT HEART CATH AND CORONARY ANGIOGRAPHY N/A 02/23/2020   Procedure: LEFT HEART CATH AND CORONARY ANGIOGRAPHY;  Surgeon: Swaziland, Peter M, MD;  Location: [facility] INVASIVE CV LAB;  Service: Cardiovascular;  Laterality: N/A;   UMBILICAL HERNIA REPAIR  1975    Family History  Problem Relation Age of Onset   CAD Mother        3V CABG in his 105s   Cancer Mother    Lung cancer Father    Breast cancer Father    Heart disease Father    Colon cancer Sister        died age 88   Cancer Sister     Social History   Socioeconomic History   Marital status: Married    Spouse name: Not on file   Number of children: Not on file   Years of education: Not on file   Highest education level: Not on file  Occupational History   Not on file  Tobacco Use   Smoking status: Former    Packs/day: 1.00    Years: 27.00    Pack years: 27.00    Types: Cigarettes    Quit date: 09/05/1993    Years since quitting: 27.7   Smokeless tobacco: Never  Substance and Sexual Activity   Alcohol use: Not Currently    Comment: 27 years ago   Drug use: Not Currently  Types: Marijuana    Comment: 27 years ago   Sexual activity: Yes    Comment: unneeded  Other Topics Concern   Not on file  Social History Narrative   Not on file   Social Determinants of Health   Financial Resource Strain: Not on file  Food Insecurity: Not on file  Transportation Needs: Not on file  Physical Activity: Not on file  Stress: Not on file  Social Connections: Not on file  Intimate Partner Violence: Not on file    Outpatient Medications Prior to Visit  Medication Sig Dispense Refill   aspirin EC 81 MG tablet Take 81 mg by mouth daily. Swallow whole.     atorvastatin (LIPITOR) 80 MG tablet TAKE ONE TABLET BY MOUTH DAILY 90 tablet 3   Multiple Vitamins-Minerals (MULTI FOR HIM 50+) TABS 1 tablet     nitroGLYCERIN (NITROSTAT) 0.4 MG SL tablet PLACE 1 TABLET (0.4 MG TOTAL) UNDER THE TONGUE  EVERY FIVE MINUTES AS NEEDED FOR CHEST PAIN. 25 tablet 0   sildenafil (VIAGRA) 50 MG tablet Take 1-2 tablets by mouth daily as needed.     No facility-administered medications prior to visit.    No Known Allergies  ROS Review of Systems    Objective:    Physical Exam Constitutional:      Appearance: Normal appearance. He is well-developed.  HENT:     Head: Normocephalic and atraumatic.  Cardiovascular:     Rate and Rhythm: Normal rate and regular rhythm.     Heart sounds: Normal heart sounds.  Pulmonary:     Effort: Pulmonary effort is normal.     Breath sounds: Normal breath sounds.  Skin:    General: Skin is warm and dry.  Neurological:     Mental Status: He is alert and oriented to person, place, and time. Mental status is at baseline.  Psychiatric:        Behavior: Behavior normal.    BP 125/81    Pulse 63    Wt 194 lb (88 kg)    SpO2 99%    BMI 28.65 kg/m  Wt Readings from Last 3 Encounters:  06/12/21 194 lb (88 kg)  02/22/21 187 lb 4.8 oz (85 kg)  12/05/20 184 lb (83.5 kg)     Health Maintenance Due  Topic Date Due   HIV Screening  Never done   COVID-19 Vaccine (4 - Booster for Pfizer series) 04/08/2020    There are no preventive care reminders to display for this patient.  No results found for: TSH Lab Results  Component Value Date   WBC 6.2 04/06/2020   HGB 14.8 04/06/2020   HCT 45.3 04/06/2020   MCV 91.3 04/06/2020   PLT 289 04/06/2020   Lab Results  Component Value Date   NA 139 04/06/2020   K 3.9 04/06/2020   CO2 26 04/06/2020   GLUCOSE 80 04/06/2020   BUN 16 04/06/2020   CREATININE 0.87 04/06/2020   BILITOT 1.3 (H) 06/08/2020   ALKPHOS 71 06/08/2020   AST 32 06/08/2020   ALT 66 (H) 06/08/2020   PROT 6.7 06/08/2020   ALBUMIN 4.6 06/08/2020   CALCIUM 9.0 04/06/2020   ANIONGAP 11 04/06/2020   Lab Results  Component Value Date   CHOL 116 06/08/2020   Lab Results  Component Value Date   HDL 44 06/08/2020   Lab Results   Component Value Date   LDLCALC 56 06/08/2020   Lab Results  Component Value Date   TRIG 77 06/08/2020  Lab Results  Component Value Date   CHOLHDL 2.6 06/08/2020   No results found for: HGBA1C    Assessment & Plan:   Problem List Items Addressed This Visit       Cardiovascular and Mediastinum   Atherosclerosis of abdominal aorta (HCC) - Primary    Continue high-dose statin.  Plan to recheck lipids and liver enzymes.      Relevant Medications   aspirin EC 81 MG tablet   Other Relevant Orders   Lipid Panel w/reflex Direct LDL   COMPLETE METABOLIC PANEL WITH GFR     Digestive   Gallstones    No recent or active abdominal pain.        Other   Gilbert's syndrome    Check bilirubin today.  Most consistent with Gill Bears syndrome.      Elevated liver enzymes    Lan to recheck today.  Avoids alcohol.  Hopefully continues to improve as he is really made some great changes to his diet and is exercising.  Also consider that it could be statin related.      Relevant Orders   Lipid Panel w/reflex Direct LDL   COMPLETE METABOLIC PANEL WITH GFR   Bilirubin, fractionated (tot/dir/indir)   Other Visit Diagnoses     Elevated bilirubin       Relevant Orders   Bilirubin, fractionated (tot/dir/indir)       No orders of the defined types were placed in this encounter.   Follow-up: Return in about 1 year (around 06/12/2022) for Wellness Exam and labs .    Nani Gasser, MD

## 2021-06-12 NOTE — Assessment & Plan Note (Signed)
Lan to recheck today.  Avoids alcohol.  Hopefully continues to improve as he is really made some great changes to his diet and is exercising.  Also consider that it could be statin related.

## 2021-06-12 NOTE — Assessment & Plan Note (Signed)
Continue high-dose statin.  Plan to recheck lipids and liver enzymes.

## 2021-06-12 NOTE — Assessment & Plan Note (Signed)
Check bilirubin today.  Most consistent with Gill Bears syndrome.

## 2021-06-13 LAB — LIPID PANEL W/REFLEX DIRECT LDL
Cholesterol: 135 mg/dL (ref <200)
HDL: 56 mg/dL (ref >=40)
LDL Cholesterol (Calc): 62 mg/dL (calc)
Non-HDL Cholesterol (Calc): 79 mg/dL (calc) (ref <130)
Total CHOL/HDL Ratio: 2.4 (calc) (ref <5.0)
Triglycerides: 88 mg/dL (ref <150)

## 2021-06-13 LAB — COMPLETE METABOLIC PANEL WITH GFR
AG Ratio: 2.3 (calc) (ref 1.0–2.5)
ALT: 35 U/L (ref 9–46)
AST: 20 U/L (ref 10–35)
Albumin: 4.4 g/dL (ref 3.6–5.1)
Alkaline phosphatase (APISO): 73 U/L (ref 35–144)
BUN: 12 mg/dL (ref 7–25)
CO2: 32 mmol/L (ref 20–32)
Calcium: 9.3 mg/dL (ref 8.6–10.3)
Chloride: 106 mmol/L (ref 98–110)
Creat: 0.81 mg/dL (ref 0.70–1.30)
Globulin: 1.9 g/dL (calc) (ref 1.9–3.7)
Glucose, Bld: 91 mg/dL (ref 65–99)
Potassium: 4.7 mmol/L (ref 3.5–5.3)
Sodium: 143 mmol/L (ref 135–146)
Total Bilirubin: 1.1 mg/dL (ref 0.2–1.2)
Total Protein: 6.3 g/dL (ref 6.1–8.1)
eGFR: 102 mL/min/{1.73_m2} (ref >=60)

## 2021-06-13 LAB — BILIRUBIN, FRACTIONATED(TOT/DIR/INDIR)
Bilirubin, Direct: 0.2 mg/dL (ref 0.0–0.2)
Indirect Bilirubin: 0.9 mg/dL (calc) (ref 0.2–1.2)
Total Bilirubin: 1.1 mg/dL (ref 0.2–1.2)

## 2021-06-13 NOTE — Progress Notes (Signed)
Hi Allen Burns, LDL cholesterol is under 70 which is target.  Total cholesterol looks great.  And your HDL which is the good cholesterol has come up, which is fantastic!  Your metabolic panel looks great bilirubin is back in the normal range and liver enzymes are completely normal.  Keep up the great lifestyle and dietary changes.

## 2021-06-14 DIAGNOSIS — S39012D Strain of muscle, fascia and tendon of lower back, subsequent encounter: Secondary | ICD-10-CM | POA: Diagnosis not present

## 2021-06-19 DIAGNOSIS — S39012D Strain of muscle, fascia and tendon of lower back, subsequent encounter: Secondary | ICD-10-CM | POA: Diagnosis not present

## 2021-06-21 DIAGNOSIS — S39012D Strain of muscle, fascia and tendon of lower back, subsequent encounter: Secondary | ICD-10-CM | POA: Diagnosis not present

## 2021-06-26 DIAGNOSIS — S39012D Strain of muscle, fascia and tendon of lower back, subsequent encounter: Secondary | ICD-10-CM | POA: Diagnosis not present

## 2021-07-26 DIAGNOSIS — F4322 Adjustment disorder with anxiety: Secondary | ICD-10-CM | POA: Diagnosis not present

## 2021-08-02 DIAGNOSIS — R748 Abnormal levels of other serum enzymes: Secondary | ICD-10-CM | POA: Diagnosis not present

## 2021-08-21 ENCOUNTER — Telehealth: Payer: Self-pay | Admitting: Cardiology

## 2021-08-21 NOTE — Telephone Encounter (Signed)
Pt c/o of Chest Pain: STAT if CP now or developed within 24 hours ? ?1. Are you having CP right now? yes ? ?2. Are you experiencing any other symptoms (ex. SOB, nausea, vomiting, sweating)? SOB  ? ?3. How long have you been experiencing CP? About a month ? ?4. Is your CP continuous or coming and going? Comes and goes ? ?5. Have you taken Nitroglycerin? no ??  ? ?Patient states he is having some SOB and pain in pectoral area of his chest. He says it is mild and has had it for about a month off and on. ?

## 2021-08-21 NOTE — Telephone Encounter (Signed)
Spoke with patient who has been having some intermittent chest pains over the las month. Did have some shortness of breath while working in the yard doing mulch work but thinks was just work he was doing ?Patient wanted sooner appointment than October.  ?Advised Dr Cristal Deer working at the hospital but she had openings next week ?Patient stated it was not urgent, next week ok, and would go to ED if became urgent ?Scheduled appointment for next week and advised patient to go to ED if worsening symptoms or felt like when he had his previous stent. Patient verbalized understanding  ?

## 2021-08-23 DIAGNOSIS — F4322 Adjustment disorder with anxiety: Secondary | ICD-10-CM | POA: Diagnosis not present

## 2021-08-29 ENCOUNTER — Ambulatory Visit (HOSPITAL_BASED_OUTPATIENT_CLINIC_OR_DEPARTMENT_OTHER): Payer: BC Managed Care – PPO | Admitting: Cardiology

## 2021-09-01 ENCOUNTER — Ambulatory Visit (HOSPITAL_BASED_OUTPATIENT_CLINIC_OR_DEPARTMENT_OTHER): Payer: BC Managed Care – PPO | Admitting: Cardiology

## 2021-09-01 ENCOUNTER — Encounter (HOSPITAL_BASED_OUTPATIENT_CLINIC_OR_DEPARTMENT_OTHER): Payer: Self-pay | Admitting: Cardiology

## 2021-09-01 VITALS — BP 110/78 | HR 55 | Ht 69.0 in | Wt 191.0 lb

## 2021-09-01 DIAGNOSIS — E7800 Pure hypercholesterolemia, unspecified: Secondary | ICD-10-CM

## 2021-09-01 DIAGNOSIS — I2510 Atherosclerotic heart disease of native coronary artery without angina pectoris: Secondary | ICD-10-CM | POA: Diagnosis not present

## 2021-09-01 DIAGNOSIS — R079 Chest pain, unspecified: Secondary | ICD-10-CM

## 2021-09-01 DIAGNOSIS — Z955 Presence of coronary angioplasty implant and graft: Secondary | ICD-10-CM | POA: Diagnosis not present

## 2021-09-01 NOTE — Patient Instructions (Signed)
Medication Instructions:  Your Physician recommend you continue on your current medication as directed.    *If you need a refill on your cardiac medications before your next appointment, please call your pharmacy*   Lab Work: None ordered today   Testing/Procedures: Cardiac Pet Scan [facility]    Follow-Up: At [facility], you and your health needs are our priority.  As part of our continuing mission to provide you with exceptional heart care, we have created designated Provider Care Teams.  These Care Teams include your primary Cardiologist (physician) and Advanced Practice Providers (APPs -  Physician Assistants and Nurse Practitioners) who all work together to provide you with the care you need, when you need it.  We recommend signing up for the patient portal called "MyChart".  Sign up information is provided on this After Visit Summary.  MyChart is used to connect with patients for Virtual Visits (Telemedicine).  Patients are able to view lab/test results, encounter notes, upcoming appointments, etc.  Non-urgent messages can be sent to your provider as well.   To learn more about what you can do with MyChart, go to [email or web address].    Your next appointment:   5 month(s)  The format for your next appointment:   In Person  Provider:   Jodelle Red, MD{  How to Prepare for Your Cardiac PET/CT Stress Test:  1. Please do not take these medications before your test:   Medications that may interfere with the cardiac pharmacological stress agent (ex. nitrates or beta-blockers) the day of the exam. Theophylline containing medications for 12 hours. Dipyridamole 48 hours prior to the test. Your remaining medications may be taken with water.  2. Nothing to eat or drink, except water, 3 hours prior to arrival time.   NO caffeine/decaffeinated products, or chocolate 12 hours prior to arrival.  3. NO perfume, cologne or lotion  4. Total time is 1 to 2  hours; you may want to bring reading material for the waiting time.  5. Please report to Admitting at the [facility] Main Entrance 60 minutes early for your test.  [address] [place], [place] [address]  Diabetic Preparation:  Hold oral medications. You may take NPH and Lantus insulin. Do not take Humalog or Humulin R (Regular Insulin) the day of your test. Check blood sugars prior to leaving the house. If able to eat breakfast prior to 3 hour fasting, you may take all medications, including your insulin, Do not worry if you miss your breakfast dose of insulin - start at your next meal.  IF YOU THINK YOU MAY BE PREGNANT, OR ARE NURSING PLEASE INFORM THE TECHNOLOGIST.  In preparation for your appointment, medication and supplies will be purchased.  Appointment availability is limited, so if you need to cancel or reschedule, please call the Radiology Department at [phone]  24 hours in advance to avoid a cancellation fee of $100.00  What to Expect After you Arrive:  Once you arrive and check in for your appointment, you will be taken to a preparation room within the Radiology Department.  A technologist or Nurse will obtain your medical history, verify that you are correctly prepped for the exam, and explain the procedure.  Afterwards,  an IV will be started in your arm and electrodes will be placed on your skin for EKG monitoring during the stress portion of the exam. Then you will be escorted to the PET/CT scanner.  There, staff will get you positioned on the scanner  and obtain a blood pressure and EKG.  During the exam, you will continue to be connected to the EKG and blood pressure machines.  A small, safe amount of a radioactive tracer will be injected in your IV to obtain a series of pictures of your heart along with an injection of a stress agent.    After your Exam:  It is recommended that you eat a meal and drink a caffeinated beverage to counter act any effects of  the stress agent.  Drink plenty of fluids for the remainder of the day and urinate frequently for the first couple of hours after the exam.  Your doctor will inform you of your test results within 7-10 business days.  For questions about your test or how to prepare for your test, please call: Rockwell Alexandria, Cardiac Imaging Nurse Navigator  Larey Brick, Cardiac Imaging Nurse Navigator Office: [phone]

## 2021-09-01 NOTE — Progress Notes (Signed)
Cardiology Office Note:    Date:  09/01/2021   ID:  Allen Burns, DOB Jun 26, 1960, MRN [id number]  PCP:  Hali Marry, MD  Cardiologist:  Buford Dresser, MD  Referring MD: Hali Marry, *   CC: follow up  History of Present Illness:    Allen Burns is a 61 y.o. male with a hx of erectile dysfunction, former tobacco use, family history of heart disease, CAD s/p PCI 02/23/20 who is seen for follow up today. I initially met him 10/4/21as a new consult at the request of Hali Marry, * for the evaluation and management of exertional chest discomfort.  Today: He called the office 08/21/2021 reporting intermittent chest pain over the prior month. Also had shortness of breath while working in the yard. He was given an appointment today for further evaluation.  For the past 1-2 months his chest pain has progressed to a daily left chest/shoulder pain that occasionally radiates distally in his LUE. Previously his chest pain was less frequent.  He describes this as more of an ache or soreness, sometimes like a throbbing/shooting pain. He can feel his pain when pressing into the area, but it doesn't seem to actually change. It mostly occurs randomly or with heavy exertion such as shoveling holes in the yard. He also describes his pain as somewhat mild, and "random but regular." Initially he noticed associated lightheadedness, but his lightheadedness is now only occurring with exercise such as hot yoga. Of note, while running for exercise or performing yoga he is unable to reproduce the chest pain. Additionally he complains of shortness of breath while exercising. He has never needed to take his nitroglycerin.  Lately he is under significant stress. 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  He states that he "cannot drink coffee responsibly" so he has stopped drinking coffee at this time.  He remains compliant with his medications with  only rare missed doses.  He denies any palpitations, or peripheral edema. No headaches, syncope, orthopnea, or PND.  Past Medical History:  Diagnosis Date   Asthma    Erectile dysfunction    Hyperlipidemia     Past Surgical History:  Procedure Laterality Date   CORONARY STENT INTERVENTION N/A 02/23/2020   Procedure: CORONARY STENT INTERVENTION;  Surgeon: Martinique, Peter M, MD;  Location: [place] [facility];  Service: Cardiovascular;  Laterality: N/A;   INTRAVASCULAR ULTRASOUND/IVUS N/A 02/23/2020   Procedure: Intravascular Ultrasound/IVUS;  Surgeon: Martinique, Peter M, MD;  Location: [facility];  Service: Cardiovascular;  Laterality: N/A;   LEFT HEART CATH AND CORONARY ANGIOGRAPHY N/A 02/23/2020   Procedure: LEFT HEART CATH AND CORONARY ANGIOGRAPHY;  Surgeon: Martinique, Peter M, MD;  Location: [facility] [facility];  Service: Cardiovascular;  Laterality: N/A;   UMBILICAL HERNIA REPAIR  1975    Current Medications: Current Outpatient Medications on File Prior to Visit  Medication Sig   aspirin EC 81 MG tablet Take 81 mg by mouth daily. Swallow whole.   atorvastatin (LIPITOR) 80 MG tablet TAKE ONE TABLET BY MOUTH DAILY   Multiple Vitamins-Minerals (MULTI FOR HIM 50+) TABS 1 tablet   nitroGLYCERIN (NITROSTAT) 0.4 MG SL tablet PLACE 1 TABLET (0.4 MG TOTAL) UNDER THE TONGUE EVERY FIVE MINUTES AS NEEDED FOR CHEST PAIN.   No current facility-administered medications on file prior to visit.     Allergies:   Patient has no known allergies.   Social History   Tobacco Use   Smoking  status: Former    Packs/day: 1.00    Years: 27.00    Pack years: 27.00    Types: Cigarettes    Quit date: 09/05/1993    Years since quitting: 28.0   Smokeless tobacco: Never  Substance Use Topics   Alcohol use: Not Currently    Comment: 27 years ago   Drug use: Not Currently    Types: Marijuana    Comment: 27 years ago    Family History: family history includes Breast cancer in his father; CAD in  his mother; Cancer in his mother and sister; Colon cancer in his sister; Heart disease in his father; Lung cancer in his father.  ROS:   Please see the history of present illness.   (+) Chest pain (+) Lightheadedness (+) Shortness of breath (+) Stress Additional pertinent ROS otherwise unremarkable.    EKGs/Labs/Other Studies Reviewed:    The following studies were reviewed today:  Cath 02/23/20 Prox LAD to Mid LAD lesion is 40% stenosed. Mid Cx lesion is 90% stenosed. Post intervention, there is a 0% residual stenosis. A drug-eluting stent was successfully placed using a STENT RESOLUTE ONYX 3.0X18. The left ventricular systolic function is normal. LV end diastolic pressure is normal. The left ventricular ejection fraction is 55-65% by visual estimate.   1. Single vessel obstructive CAD 2. Normal LV function 3. Normal LVEDP 4. Successful PCI of the mid LCx with DES x 1 and IVUS guidance.   Plan: DAPT for one year. Anticipate same day DC.   Diagnostic: Dominance: Right   Intervention:    CTA with FFR 02/09/20 Coronary Arteries:  Normal coronary origin.  Right dominance.   RCA is a large dominant artery that gives rise to PDA and PLVB. There is no plaque.   Left main is a large artery that gives rise to LAD and LCX arteries. There is no plaque.   LAD is a large vessel that gives rise to a small D1 and a large branching D2. There is minimal calcified plaque in the proximal LAD with associated stenosis of 0-24%. There is moderate mixed plaque in the mid LAD with associated stenosis of 50-69%.   LCX is a non-dominant artery that gives rise to a small OM1 branch and large OM2 branch. There is mixed calcified plaque in the mid LCx with associated stenosis of 50-69% but possibly > 70%.   Other findings:   Normal pulmonary vein drainage into the left atrium.   Normal let atrial appendage without a thrombus.   Normal size of the pulmonary artery.    IMPRESSION: 1. Coronary calcium score of 127. This was 81st percentile for age and sex matched control.   2.  Normal coronary origin with right dominance.   3.  Moderate atherosclerosis.  CAD RADS 3.   4. Consider symptom-guided anti-ischemic and preventive pharmacotherapy as well as risk factor modification per guideline-directed care.   5.  This study has been submitted for FFR flow analysis.  FFR: 1. Left Main: No significant stenosis.  LM FFR = 0.99.   2. LAD: NO significant stenosis. Proximal FFR = 0.97, mid FFR = 0.80, Distal FFR = 0.72.   3. LCX: Possible flow limiting lesion in proximal to mid LCx. Proximal FFR = 1.00, Mid FFR = 0.61, Distal FFR = 0.52.   4. Ramus: No significant stenosis. Proximal FFR =, Mid FFR=, Distal FFR =.   5. RCA: No significant stenosis. Proximal FFR = 0.99, Mid FFR = 0.96, Distal FFR = 0.93.  IMPRESSION: 1. Coronary CT FFR flow analysis demonstrates possible flow limiting lesion in the proximal to mid LCx and gradual decrease in flow in LAD likely related to small vessel disease.   2.  Recommend cardiac catheterization.   EKG:  EKG is personally reviewed.   09/01/2021: sinus bradycardia at 55 bpm, early repol 02/22/2021: sinus bradycardia at 57 bpm, early repol 04/07/20: sinus bradycardia at 58 bpm  Recent Labs: 06/12/2021: ALT 35; BUN 12; Creat 0.81; Potassium 4.7; Sodium 143   Recent Lipid Panel    Component Value Date/Time   CHOL 135 06/12/2021 0000   CHOL 116 06/08/2020 0922   TRIG 88 06/12/2021 0000   HDL 56 06/12/2021 0000   HDL 44 06/08/2020 0922   CHOLHDL 2.4 06/12/2021 0000   LDLCALC 62 06/12/2021 0000    Physical Exam:    VS:  BP 110/78 (BP Location: Left Arm, Patient Position: Sitting, Cuff Size: Normal)   Pulse (!) 55   Ht 5' 9" (1.753 m)   Wt 191 lb (86.6 kg)   BMI 28.21 kg/m     Wt Readings from Last 3 Encounters:  09/01/21 191 lb (86.6 kg)  06/12/21 194 lb (88 kg)  02/22/21 187 lb 4.8 oz (85 kg)     GEN: Well nourished, well developed in no acute distress HEENT: Normal, moist mucous membranes NECK: No JVD CARDIAC: regular rhythm, normal S1 and S2, no rubs or gallops. No murmur. VASCULAR: Radial and DP pulses 2+ bilaterally. No carotid bruits RESPIRATORY:  Clear to auscultation without rales, wheezing or rhonchi  ABDOMEN: Soft, non-tender, non-distended MUSCULOSKELETAL:  Ambulates independently; Mild tenderness and muscle knot of left chest SKIN: Warm and dry, no edema NEUROLOGIC:  Alert and oriented x 3. No focal neuro deficits noted. PSYCHIATRIC:  Normal affect   ASSESSMENT:    1. Chest pain, unspecified type   2. Coronary artery disease involving native coronary artery of native heart without angina pectoris   3. Status post coronary artery stent placement   4. Pure hypercholesterolemia      PLAN:    CAD s/p PCI 02/2020 Hypercholesterolemia -diagnosed by CT cardiac, confirmed on cath -has had increasing chest discomfort, reviewed both cardiac and noncardiac potential etiology today. Mild, has not required nitro -given concerns, discussed options for further evaluation today. After shared decision making, will pursue cardiac PET -Aspirin 81 mg long term, completed 1 year of ticagrelor -statin: atorvastatin 80 mg daily -LDL goal <70, last LDL 62 -antianginals: stopped beta blocker, has PRN nitro -counseled on diet recommendations and AHA exercise guidelines, below -risk factor modification, as below -counseled on red flag warning signs that need immediate medical attention.   Family history of heart disease Cardiac risk counseling and prevention recommendations: -recommend heart healthy/Mediterranean diet, with whole grains, fruits, vegetable, fish, lean meats, nuts, and olive oil. Limit salt. -recommend moderate walking, 3-5 times/week for 30-50 minutes each session. Aim for at least 150 minutes.week. Goal should be pace of 3 miles/hours, or walking 1.5 miles in 30  minutes -recommend avoidance of tobacco products. Avoid excess alcohol.  Plan for follow up: As scheduled with me or sooner as needed.  Buford Dresser, MD, PhD [place]  [facility]    Medication Adjustments/Labs and Tests Ordered: Current medicines are reviewed at length with the patient today.  Concerns regarding medicines are outlined above.   Orders Placed This Encounter  Procedures   NM PET CT CARDIAC PERFUSION MULTI W/ABSOLUTE BLOODFLOW   Cardiac Stress Test: Informed Consent Details: Physician/Practitioner  Attestation; Transcribe to consent form and obtain patient signature   EKG 12-Lead   No orders of the defined types were placed in this encounter.  Patient Instructions  Medication Instructions:  Your Physician recommend you continue on your current medication as directed.    *If you need a refill on your cardiac medications before your next appointment, please call your pharmacy*   Lab Work: None ordered today   Testing/Procedures: Cardiac Pet Scan [facility]    Follow-Up: At [facility], you and your health needs are our priority.  As part of our continuing mission to provide you with exceptional heart care, we have created designated Provider Care Teams.  These Care Teams include your primary Cardiologist (physician) and Advanced Practice Providers (APPs -  Physician Assistants and Nurse Practitioners) who all work together to provide you with the care you need, when you need it.  We recommend signing up for the patient portal called "MyChart".  Sign up information is provided on this After Visit Summary.  MyChart is used to connect with patients for Virtual Visits (Telemedicine).  Patients are able to view lab/test results, encounter notes, upcoming appointments, etc.  Non-urgent messages can be sent to your provider as well.   To learn more about what you can do with MyChart, go to [email or web address].    Your next appointment:    5 month(s)  The format for your next appointment:   In Person  Provider:   Buford Dresser, MD{  How to Prepare for Your Cardiac PET/CT Stress Test:  1. Please do not take these medications before your test:   Medications that may interfere with the cardiac pharmacological stress agent (ex. nitrates or beta-blockers) the day of the exam. Theophylline containing medications for 12 hours. Dipyridamole 48 hours prior to the test. Your remaining medications may be taken with water.  2. Nothing to eat or drink, except water, 3 hours prior to arrival time.   NO caffeine/decaffeinated products, or chocolate 12 hours prior to arrival.  3. NO perfume, cologne or lotion  4. Total time is 1 to 2 hours; you may want to bring reading material for the waiting time.  5. Please report to Admitting at the [facility] Main Entrance 60 minutes early for your test.  [place], [place] [id number]  Diabetic Preparation:  Hold oral medications. You may take NPH and Lantus insulin. Do not take Humalog or Humulin R (Regular Insulin) the day of your test. Check blood sugars prior to leaving the house. If able to eat breakfast prior to 3 hour fasting, you may take all medications, including your insulin, Do not worry if you miss your breakfast dose of insulin - start at your next meal.  IF YOU THINK YOU MAY BE PREGNANT, OR ARE NURSING PLEASE INFORM THE TECHNOLOGIST.  In preparation for your appointment, medication and supplies will be purchased.  Appointment availability is limited, so if you need to cancel or reschedule, please call the Radiology Department at [phone]  24 hours in advance to avoid a cancellation fee of $100.00  What to Expect After you Arrive:  Once you arrive and check in for your appointment, you will be taken to a preparation room within the Radiology Department.  A technologist or Nurse will obtain your medical history, verify that you are  correctly prepped for the exam, and explain the procedure.  Afterwards,  an IV will be started in your arm and electrodes will be placed on  your skin for EKG monitoring during the stress portion of the exam. Then you will be escorted to the PET/CT scanner.  There, staff will get you positioned on the scanner and obtain a blood pressure and EKG.  During the exam, you will continue to be connected to the EKG and blood pressure machines.  A small, safe amount of a radioactive tracer will be injected in your IV to obtain a series of pictures of your heart along with an injection of a stress agent.    After your Exam:  It is recommended that you eat a meal and drink a caffeinated beverage to counter act any effects of the stress agent.  Drink plenty of fluids for the remainder of the day and urinate frequently for the first couple of hours after the exam.  Your doctor will inform you of your test results within 7-10 business days.  For questions about your test or how to prepare for your test, please call: Marchia Bond, Cardiac Imaging Nurse Navigator  Gordy Clement, Cardiac Imaging Nurse Navigator Office: [phone]          [facility],acting as a scribe for Buford Dresser, MD.,have documented all relevant documentation on the behalf of Buford Dresser, MD,as directed by  Buford Dresser, MD while in the presence of Buford Dresser, MD.  I, Buford Dresser, MD, have reviewed all documentation for this visit. The documentation on 09/01/21 for the exam, diagnosis, procedures, and orders are all accurate and complete.   Signed, Buford Dresser, MD PhD 09/01/2021    [place]

## 2021-09-20 DIAGNOSIS — F4322 Adjustment disorder with anxiety: Secondary | ICD-10-CM | POA: Diagnosis not present

## 2021-10-04 DIAGNOSIS — F4322 Adjustment disorder with anxiety: Secondary | ICD-10-CM | POA: Diagnosis not present

## 2021-10-25 DIAGNOSIS — F4322 Adjustment disorder with anxiety: Secondary | ICD-10-CM | POA: Diagnosis not present

## 2021-11-13 ENCOUNTER — Telehealth (HOSPITAL_COMMUNITY): Payer: Self-pay | Admitting: *Deleted

## 2021-11-13 NOTE — Telephone Encounter (Signed)
Attempted to call patient regarding upcoming cardiac PET appointment. Left message on voicemail with name and callback number  Larey Brick RN Navigator Cardiac Imaging [facility] [phone] Office [phone] Cell  Left message to avoid caffeine 12 hours prior to scan.

## 2021-11-13 NOTE — Telephone Encounter (Signed)
Patient returning call regarding his cardiac PET. He would like to defer his PET scan due to financial issues and since his symptoms have improved. He wishes to discuss with the cardiologist at his scheduled appointment in the new few months.  Larey Brick RN Navigator Cardiac Imaging [facility] [phone] Office [phone] Cell

## 2021-11-14 ENCOUNTER — Encounter (HOSPITAL_COMMUNITY): Admission: RE | Admit: 2021-11-14 | Payer: BC Managed Care – PPO | Source: Ambulatory Visit

## 2021-11-15 DIAGNOSIS — F4322 Adjustment disorder with anxiety: Secondary | ICD-10-CM | POA: Diagnosis not present

## 2021-12-13 DIAGNOSIS — F4322 Adjustment disorder with anxiety: Secondary | ICD-10-CM | POA: Diagnosis not present

## 2021-12-14 ENCOUNTER — Encounter (HOSPITAL_COMMUNITY): Payer: Self-pay | Admitting: Emergency Medicine

## 2021-12-14 ENCOUNTER — Emergency Department (HOSPITAL_COMMUNITY): Payer: BC Managed Care – PPO

## 2021-12-14 ENCOUNTER — Other Ambulatory Visit: Payer: Self-pay

## 2021-12-14 ENCOUNTER — Emergency Department (HOSPITAL_COMMUNITY)
Admission: EM | Admit: 2021-12-14 | Discharge: 2021-12-15 | Discharge status: Against Medical Advice | Payer: BC Managed Care – PPO | Attending: Emergency Medicine | Admitting: Emergency Medicine

## 2021-12-14 DIAGNOSIS — Z5321 Procedure and treatment not carried out due to patient leaving prior to being seen by health care provider: Secondary | ICD-10-CM | POA: Insufficient documentation

## 2021-12-14 DIAGNOSIS — S61452A Open bite of left hand, initial encounter: Secondary | ICD-10-CM | POA: Diagnosis not present

## 2021-12-14 DIAGNOSIS — M7989 Other specified soft tissue disorders: Secondary | ICD-10-CM | POA: Diagnosis not present

## 2021-12-14 DIAGNOSIS — W5501XA Bitten by cat, initial encounter: Secondary | ICD-10-CM | POA: Diagnosis not present

## 2021-12-14 DIAGNOSIS — L089 Local infection of the skin and subcutaneous tissue, unspecified: Secondary | ICD-10-CM | POA: Diagnosis not present

## 2021-12-14 LAB — COMPREHENSIVE METABOLIC PANEL
ALT: 17 U/L (ref 0–44)
AST: 20 U/L (ref 15–41)
Albumin: 3.9 g/dL (ref 3.5–5.0)
Alkaline Phosphatase: 65 U/L (ref 38–126)
Anion gap: 6 (ref 5–15)
BUN: 12 mg/dL (ref 6–20)
CO2: 28 mmol/L (ref 22–32)
Calcium: 9 mg/dL (ref 8.9–10.3)
Chloride: 106 mmol/L (ref 98–111)
Creatinine, Ser: 0.9 mg/dL (ref 0.61–1.24)
GFR, Estimated: >60 mL/min (ref >60)
Glucose, Bld: 102 mg/dL — ABNORMAL HIGH (ref 70–99)
Potassium: 3.7 mmol/L (ref 3.5–5.1)
Sodium: 140 mmol/L (ref 135–145)
Total Bilirubin: 2 mg/dL — ABNORMAL HIGH (ref 0.3–1.2)
Total Protein: 6.3 g/dL — ABNORMAL LOW (ref 6.5–8.1)

## 2021-12-14 LAB — CBC WITH DIFFERENTIAL/PLATELET
Abs Immature Granulocytes: 0.03 10*3/uL (ref 0.00–0.07)
Basophils Absolute: 0 10*3/uL (ref 0.0–0.1)
Basophils Relative: 0 %
Eosinophils Absolute: 0.1 10*3/uL (ref 0.0–0.5)
Eosinophils Relative: 1 %
HCT: 41.9 % (ref 39.0–52.0)
Hemoglobin: 14.3 g/dL (ref 13.0–17.0)
Immature Granulocytes: 0 %
Lymphocytes Relative: 17 %
Lymphs Abs: 1.9 10*3/uL (ref 0.7–4.0)
MCH: 30.4 pg (ref 26.0–34.0)
MCHC: 34.1 g/dL (ref 30.0–36.0)
MCV: 89.1 fL (ref 80.0–100.0)
Monocytes Absolute: 1.1 10*3/uL — ABNORMAL HIGH (ref 0.1–1.0)
Monocytes Relative: 10 %
Neutro Abs: 7.7 10*3/uL (ref 1.7–7.7)
Neutrophils Relative %: 72 %
Platelets: 257 10*3/uL (ref 150–400)
RBC: 4.7 MIL/uL (ref 4.22–5.81)
RDW: 11.9 % (ref 11.5–15.5)
WBC: 10.8 10*3/uL — ABNORMAL HIGH (ref 4.0–10.5)
nRBC: 0 % (ref 0.0–0.2)

## 2021-12-14 NOTE — ED Triage Notes (Signed)
Patient presents with left hand swelling/redness extending to left forearm , bitten by his cat yesterday . Low grade fever .

## 2021-12-15 ENCOUNTER — Ambulatory Visit
Admission: RE | Admit: 2021-12-15 | Discharge: 2021-12-15 | Disposition: A | Discharge status: Home / Self Care | Payer: BC Managed Care – PPO | Source: Ambulatory Visit | Attending: Urgent Care | Admitting: Urgent Care

## 2021-12-15 VITALS — BP 113/79 | HR 70 | Temp 98.5°F | Resp 16

## 2021-12-15 DIAGNOSIS — L089 Local infection of the skin and subcutaneous tissue, unspecified: Secondary | ICD-10-CM

## 2021-12-15 DIAGNOSIS — S61452A Open bite of left hand, initial encounter: Secondary | ICD-10-CM

## 2021-12-15 DIAGNOSIS — G8911 Acute pain due to trauma: Secondary | ICD-10-CM | POA: Diagnosis not present

## 2021-12-15 DIAGNOSIS — M79642 Pain in left hand: Secondary | ICD-10-CM

## 2021-12-15 MED ORDER — NAPROXEN 500 MG PO TABS: 500.0000 mg | ORAL_TABLET | Freq: Two times a day (BID) | ORAL | 0 refills | Status: DC

## 2021-12-15 MED ORDER — AMOXICILLIN-POT CLAVULANATE 875-125 MG PO TABS: 1.0000 | ORAL_TABLET | Freq: Two times a day (BID) | ORAL | 0 refills | Status: DC

## 2021-12-15 NOTE — ED Triage Notes (Signed)
Pt. Sates he was bit by his cat 2 days ago. He went to [facility] ED to get a work-up and left before everything resulted. Pt. Is wanting to follow-up and is concerned for infection.

## 2021-12-15 NOTE — ED Notes (Signed)
PATIENT LEFT AMA 

## 2021-12-15 NOTE — ED Provider Notes (Signed)
[facility]  Note:  This document was prepared using Conservation officer, historic buildings and may include unintentional dictation errors.  MRN: [id number] DOB: Sep 11, 1960  Subjective:   Allen Burns is a 61 y.o. male presenting for Suffering Multiple Bites and Scratches from his cat 2 days ago.  Since then he has had worsening left hand pain, swelling, red streaking.  He went to the emergency room out of concern for infection.  He left without being seen.  He did have tests done but were not reviewed as he was not seen.  He is requesting this now.  No current facility-administered medications for this encounter.  Current Outpatient Medications:    aspirin EC 81 MG tablet, Take 81 mg by mouth daily. Swallow whole., Disp: , Rfl:    atorvastatin (LIPITOR) 80 MG tablet, TAKE ONE TABLET BY MOUTH DAILY, Disp: 90 tablet, Rfl: 3   Multiple Vitamins-Minerals (MULTI FOR HIM 50+) TABS, 1 tablet, Disp: , Rfl:    nitroGLYCERIN (NITROSTAT) 0.4 MG SL tablet, PLACE 1 TABLET (0.4 MG TOTAL) UNDER THE TONGUE EVERY FIVE MINUTES AS NEEDED FOR CHEST PAIN., Disp: 25 tablet, Rfl: 0   No Known Allergies  Past Medical History:  Diagnosis Date   Asthma    Erectile dysfunction    Hyperlipidemia      Past Surgical History:  Procedure Laterality Date   CORONARY STENT INTERVENTION N/A 02/23/2020   Procedure: CORONARY STENT INTERVENTION;  Surgeon: Swaziland, Peter M, MD;  Location: [facility];  Service: Cardiovascular;  Laterality: N/A;   INTRAVASCULAR ULTRASOUND/IVUS N/A 02/23/2020   Procedure: Intravascular Ultrasound/IVUS;  Surgeon: Swaziland, Peter M, MD;  Location: [facility];  Service: Cardiovascular;  Laterality: N/A;   LEFT HEART CATH AND CORONARY ANGIOGRAPHY N/A 02/23/2020   Procedure: LEFT HEART CATH AND CORONARY ANGIOGRAPHY;  Surgeon: Swaziland, Peter M, MD;  [facility];  Service: Cardiovascular;  Laterality: N/A;   UMBILICAL HERNIA REPAIR  1975     Family History  Problem Relation Age of Onset   CAD Mother        3V CABG in his 17s   Cancer Mother    Lung cancer Father    Breast cancer Father    Heart disease Father    Colon cancer Sister        died age 38   Cancer Sister     Social History   Tobacco Use   Smoking status: Former    Packs/day: 1.00    Years: 27.00    Total pack years: 27.00    Types: Cigarettes    Quit date: 09/05/1993    Years since quitting: 28.2   Smokeless tobacco: Never  Substance Use Topics   Alcohol use: Not Currently    Comment: 27 years ago   Drug use: Not Currently    Types: Marijuana    Comment: 27 years ago    ROS   Objective:   Vitals: BP 113/79   Pulse 70   Temp 98.5 F (36.9 C)   Resp 16   SpO2 95%   Physical Exam Constitutional:      General: He is not in acute distress.    Appearance: Normal appearance. He is well-developed and normal weight. He is not ill-appearing, toxic-appearing or diaphoretic.  HENT:     Head: Normocephalic and atraumatic.     Right Ear: External ear normal.     Left Ear: External ear normal.     Nose:  Nose normal.  Eyes:     General: No scleral icterus.       Right eye: No discharge.        Left eye: No discharge.     Extraocular Movements: Extraocular movements intact.  Cardiovascular:     Rate and Rhythm: Normal rate.  Pulmonary:     Effort: Pulmonary effort is normal.  Musculoskeletal:     Cervical back: Normal range of motion.     Comments: Multiple cat bites and scratches the worst of which is on the thenar eminence of the left hand.  There is warmth, redness, swelling that extends into the distal volar surface of his forearm.  Neurological:     Mental Status: He is alert and oriented to person, place, and time.  Psychiatric:        Mood and Affect: Mood normal.        Behavior: Behavior normal.        Thought Content: Thought content normal.        Judgment: Judgment normal.           Results for orders placed or  performed during the hospital encounter of 12/14/21 (from the past 24 hour(s))  CBC with Differential     Status: Abnormal   Collection Time: 12/14/21 10:52 PM  Result Value Ref Range   WBC 10.8 (H) 4.0 - 10.5 K/uL   RBC 4.70 4.22 - 5.81 MIL/uL   Hemoglobin 14.3 13.0 - 17.0 g/dL   HCT 85.6 31.4 - 97.0 %   MCV 89.1 80.0 - 100.0 fL   MCH 30.4 26.0 - 34.0 pg   MCHC 34.1 30.0 - 36.0 g/dL   RDW 26.3 78.5 - 88.5 %   Platelets 257 150 - 400 K/uL   nRBC 0.0 0.0 - 0.2 %   Neutrophils Relative % 72 %   Neutro Abs 7.7 1.7 - 7.7 K/uL   Lymphocytes Relative 17 %   Lymphs Abs 1.9 0.7 - 4.0 K/uL   Monocytes Relative 10 %   Monocytes Absolute 1.1 (H) 0.1 - 1.0 K/uL   Eosinophils Relative 1 %   Eosinophils Absolute 0.1 0.0 - 0.5 K/uL   Basophils Relative 0 %   Basophils Absolute 0.0 0.0 - 0.1 K/uL   Immature Granulocytes 0 %   Abs Immature Granulocytes 0.03 0.00 - 0.07 K/uL  Comprehensive metabolic panel     Status: Abnormal   Collection Time: 12/14/21 10:52 PM  Result Value Ref Range   Sodium 140 135 - 145 mmol/L   Potassium 3.7 3.5 - 5.1 mmol/L   Chloride 106 98 - 111 mmol/L   CO2 28 22 - 32 mmol/L   Glucose, Bld 102 (H) 70 - 99 mg/dL   BUN 12 6 - 20 mg/dL   Creatinine, Ser 0.27 0.61 - 1.24 mg/dL   Calcium 9.0 8.9 - 74.1 mg/dL   Total Protein 6.3 (L) 6.5 - 8.1 g/dL   Albumin 3.9 3.5 - 5.0 g/dL   AST 20 15 - 41 U/L   ALT 17 0 - 44 U/L   Alkaline Phosphatase 65 38 - 126 U/L   Total Bilirubin 2.0 (H) 0.3 - 1.2 mg/dL   GFR, Estimated >28 >78 mL/min   Anion gap 6 5 - 15    Assessment and Plan :   PDMP not reviewed this encounter.  1. Left hand pain   2. Animal bite of left hand with infection, initial encounter    I reviewed the x-rays, lab test  results with the patient.  Reported the swelling that is seen together with a mildly elevated white blood cell count.  Advised that these are suggestive of infection from his animal bite.  Advised that he start Augmentin.  Discussed wound  care.  Use naproxen for pain relief.  Recheck in 2 days especially if not improved. Counseled patient on potential for adverse effects with medications prescribed/recommended today, ER and return-to-clinic precautions discussed, patient verbalized understanding.    Wallis Bamberg, [place] 12/15/21 [phone]

## 2022-02-05 ENCOUNTER — Encounter (HOSPITAL_BASED_OUTPATIENT_CLINIC_OR_DEPARTMENT_OTHER): Payer: Self-pay | Admitting: Cardiology

## 2022-02-05 ENCOUNTER — Ambulatory Visit (HOSPITAL_BASED_OUTPATIENT_CLINIC_OR_DEPARTMENT_OTHER): Payer: BC Managed Care – PPO | Admitting: Cardiology

## 2022-02-05 VITALS — BP 104/60 | HR 64 | Ht 69.0 in | Wt 194.7 lb

## 2022-02-05 DIAGNOSIS — I2510 Atherosclerotic heart disease of native coronary artery without angina pectoris: Secondary | ICD-10-CM | POA: Diagnosis not present

## 2022-02-05 DIAGNOSIS — Z955 Presence of coronary angioplasty implant and graft: Secondary | ICD-10-CM | POA: Diagnosis not present

## 2022-02-05 DIAGNOSIS — E7800 Pure hypercholesterolemia, unspecified: Secondary | ICD-10-CM | POA: Diagnosis not present

## 2022-02-05 DIAGNOSIS — Z8249 Family history of ischemic heart disease and other diseases of the circulatory system: Secondary | ICD-10-CM

## 2022-02-05 DIAGNOSIS — R079 Chest pain, unspecified: Secondary | ICD-10-CM

## 2022-02-05 NOTE — Progress Notes (Signed)
Cardiology Office Note:    Date:  02/05/2022   ID:  Allen Burns, DOB 1960-09-10, MRN [id number]  PCP:  Hali Marry, MD  Cardiologist:  Buford Dresser, MD  Referring MD: Hali Marry, *   CC: follow up  History of Present Illness:    Allen Burns is a 61 y.o. male with a hx of erectile dysfunction, former tobacco use, family history of heart disease, CAD s/p PCI 02/23/20 who is seen for follow up today. I initially met him 01/18/20 as a new consult at the request of Hali Marry, * for the evaluation and management of exertional chest discomfort.  He called the office 08/21/2021 reporting intermittent chest pain over the prior month. Also had shortness of breath while working in the yard. At his last visit he reported progression of his pain to a daily left chest/shoulder pain with occasional LUE radiation distally. He could feel his pain when pressing into the area, but it didn't seem to actually change. While running for exercise or performing yoga he was unable to reproduce the chest pain. After shared decision making we discussed cardiac PET which has not yet been completed.  Today, he complains of "a bit" of intermittent chest pain which is never more than a 2/10 severity (he has not needed nitroglycerin). It is always localized in his left chest, and feels different from musculoskeletal soreness. His chest pain seems to be more noticeable with activities such as digging a hole in his yard, but it may occur while sitting still. He is able to exercise without limitations. He believes the chest pain may be related to family and job stressors.  Of note, while he was treating his recent infection from a cat bite, he noticed a pulsating sensation in his left chest.  While participating in hot yoga, he continues to have mild dizziness. He is doing better with drinking water when needed.  Regarding his diet he continues to work on cooking more of his meals at  home.  He denies any palpitations, shortness of breath, or peripheral edema. No headaches, syncope, orthopnea, or PND.   Past Medical History:  Diagnosis Date   Asthma    Erectile dysfunction    Hyperlipidemia     Past Surgical History:  Procedure Laterality Date   CORONARY STENT INTERVENTION N/A 02/23/2020   Procedure: CORONARY STENT INTERVENTION;  Surgeon: Martinique, Peter M, MD;  Location: [facility];  Service: Cardiovascular;  Laterality: N/A;   INTRAVASCULAR ULTRASOUND/IVUS N/A 02/23/2020   Procedure: Intravascular Ultrasound/IVUS;  Surgeon: Martinique, Peter M, MD;  Location: [facility];  Service: Cardiovascular;  Laterality: N/A;   LEFT HEART CATH AND CORONARY ANGIOGRAPHY N/A 02/23/2020   Procedure: LEFT HEART CATH AND CORONARY ANGIOGRAPHY;  Surgeon: Martinique, Peter M, MD;  Location: [facility];  Service: Cardiovascular;  Laterality: N/A;   UMBILICAL HERNIA REPAIR  1975    Current Medications: Current Outpatient Medications on File Prior to Visit  Medication Sig   aspirin EC 81 MG tablet Take 81 mg by mouth daily. Swallow whole.   atorvastatin (LIPITOR) 80 MG tablet TAKE ONE TABLET BY MOUTH DAILY   Multiple Vitamins-Minerals (MULTI FOR HIM 50+) TABS 1 tablet   nitroGLYCERIN (NITROSTAT) 0.4 MG SL tablet PLACE 1 TABLET (0.4 MG TOTAL) UNDER THE TONGUE EVERY FIVE MINUTES AS NEEDED FOR CHEST PAIN.   No current facility-administered medications on file prior to visit.     Allergies:   Patient has no known allergies.  Social History   Tobacco Use   Smoking status: Former    Packs/day: 1.00    Years: 27.00    Total pack years: 27.00    Types: Cigarettes    Quit date: 09/05/1993    Years since quitting: 28.4   Smokeless tobacco: Never  Substance Use Topics   Alcohol use: Not Currently    Comment: 27 years ago   Drug use: Not Currently    Types: Marijuana    Comment: 27 years ago    Family History: family history includes Breast cancer in his  father; CAD in his mother; Cancer in his mother and sister; Colon cancer in his sister; Heart disease in his father; Lung cancer in his father.  ROS:   Please see the history of present illness.   (+) Chest pain (+) Stress (+) Dizziness Additional pertinent ROS otherwise unremarkable.    EKGs/Labs/Other Studies Reviewed:    The following studies were reviewed today:  Cath 02/23/20 Prox LAD to Mid LAD lesion is 40% stenosed. Mid Cx lesion is 90% stenosed. Post intervention, there is a 0% residual stenosis. A drug-eluting stent was successfully placed using a STENT RESOLUTE ONYX 3.0X18. The left ventricular systolic function is normal. LV end diastolic pressure is normal. The left ventricular ejection fraction is 55-65% by visual estimate.   1. Single vessel obstructive CAD 2. Normal LV function 3. Normal LVEDP 4. Successful PCI of the mid LCx with DES x 1 and IVUS guidance.   Plan: DAPT for one year. Anticipate same day DC.   Diagnostic: Dominance: Right   Intervention:    CTA with FFR 02/09/20 Coronary Arteries:  Normal coronary origin.  Right dominance.   RCA is a large dominant artery that gives rise to PDA and PLVB. There is no plaque.   Left main is a large artery that gives rise to LAD and LCX arteries. There is no plaque.   LAD is a large vessel that gives rise to a small D1 and a large branching D2. There is minimal calcified plaque in the proximal LAD with associated stenosis of 0-24%. There is moderate mixed plaque in the mid LAD with associated stenosis of 50-69%.   LCX is a non-dominant artery that gives rise to a small OM1 branch and large OM2 branch. There is mixed calcified plaque in the mid LCx with associated stenosis of 50-69% but possibly > 70%.   Other findings:   Normal pulmonary vein drainage into the left atrium.   Normal let atrial appendage without a thrombus.   Normal size of the pulmonary artery.   IMPRESSION: 1. Coronary  calcium score of 127. This was 81st percentile for age and sex matched control.   2.  Normal coronary origin with right dominance.   3.  Moderate atherosclerosis.  CAD RADS 3.   4. Consider symptom-guided anti-ischemic and preventive pharmacotherapy as well as risk factor modification per guideline-directed care.   5.  This study has been submitted for FFR flow analysis.  FFR: 1. Left Main: No significant stenosis.  LM FFR = 0.99.   2. LAD: NO significant stenosis. Proximal FFR = 0.97, mid FFR = 0.80, Distal FFR = 0.72.   3. LCX: Possible flow limiting lesion in proximal to mid LCx. Proximal FFR = 1.00, Mid FFR = 0.61, Distal FFR = 0.52.   4. Ramus: No significant stenosis. Proximal FFR =, Mid FFR=, Distal FFR =.   5. RCA: No significant stenosis. Proximal FFR = 0.99, Mid FFR =  0.96, Distal FFR = 0.93.   IMPRESSION: 1. Coronary CT FFR flow analysis demonstrates possible flow limiting lesion in the proximal to mid LCx and gradual decrease in flow in LAD likely related to small vessel disease.   2.  Recommend cardiac catheterization.   EKG:  EKG is personally reviewed.   02/05/2022:  not ordered today 09/01/2021: sinus bradycardia at 55 bpm, early repol 02/22/2021: sinus bradycardia at 57 bpm, early repol 04/07/20: sinus bradycardia at 58 bpm  Recent Labs: 12/14/2021: ALT 17; BUN 12; Creatinine, Ser 0.90; Hemoglobin 14.3; Platelets 257; Potassium 3.7; Sodium 140   Recent Lipid Panel    Component Value Date/Time   CHOL 135 06/12/2021 0000   CHOL 116 06/08/2020 0922   TRIG 88 06/12/2021 0000   HDL 56 06/12/2021 0000   HDL 44 06/08/2020 0922   CHOLHDL 2.4 06/12/2021 0000   LDLCALC 62 06/12/2021 0000    Physical Exam:    VS:  BP 104/60 (BP Location: Left Arm, Patient Position: Sitting, Cuff Size: Large)   Pulse 64   Ht 5\' 9"  (1.753 m)   Wt 194 lb 11.2 oz (88.3 kg)   SpO2 96%   BMI 28.75 kg/m     Wt Readings from Last 3 Encounters:  02/05/22 194 lb 11.2 oz  (88.3 kg)  09/01/21 191 lb (86.6 kg)  06/12/21 194 lb (88 kg)    GEN: Well nourished, well developed in no acute distress HEENT: Normal, moist mucous membranes NECK: No JVD CARDIAC: regular rhythm, normal S1 and S2, no rubs or gallops. No murmur. VASCULAR: Radial and DP pulses 2+ bilaterally. No carotid bruits RESPIRATORY:  Clear to auscultation without rales, wheezing or rhonchi  ABDOMEN: Soft, non-tender, non-distended MUSCULOSKELETAL:  Ambulates independently; no chest tenderness today SKIN: Warm and dry, no edema NEUROLOGIC:  Alert and oriented x 3. No focal neuro deficits noted. PSYCHIATRIC:  Normal affect   ASSESSMENT:    1. Coronary artery disease involving native coronary artery of native heart without angina pectoris   2. Status post coronary artery stent placement   3. Chest pain, unspecified type   4. Pure hypercholesterolemia   5. Family history of heart disease      PLAN:    CAD s/p PCI 02/2020 Hypercholesterolemia -diagnosed by CT cardiac, confirmed on cath -discussed mild atypical chest pain at prior visit. Initially planned for cardiac PET, but financially this was not feasible until now. Reviewed the test, he is amenable for PET. Will reach out to team to schedule -Aspirin 81 mg long term, completed 1 year of ticagrelor -statin: atorvastatin 80 mg daily -LDL goal <70, last LDL 62 -antianginals: stopped beta blocker, has PRN nitro -counseled on diet recommendations and AHA exercise guidelines, below -risk factor modification, as below -counseled on red flag warning signs that need immediate medical attention.   Family history of heart disease Cardiac risk counseling and prevention recommendations: -recommend heart healthy/Mediterranean diet, with whole grains, fruits, vegetable, fish, lean meats, nuts, and olive oil. Limit salt. -recommend moderate walking, 3-5 times/week for 30-50 minutes each session. Aim for at least 150 minutes.week. Goal should be pace  of 3 miles/hours, or walking 1.5 miles in 30 minutes -recommend avoidance of tobacco products. Avoid excess alcohol.  Plan for follow up: 4 months or sooner as needed.  03/2020, MD, PhD [place]  [facility]    Medication Adjustments/Labs and Tests Ordered: Current medicines are reviewed at length with the patient today.  Concerns regarding medicines are outlined above.  Orders Placed This Encounter  Procedures   Cardiac Stress Test: Informed Consent Details: Physician/Practitioner Attestation; Transcribe to consent form and obtain patient signature   No orders of the defined types were placed in this encounter.  Patient Instructions  Medication Instructions:  Your Physician recommend you continue on your current medication as directed.    *If you need a refill on your cardiac medications before your next appointment, please call your pharmacy*  Lab Work: NONE   Testing/Procedures: NONE  Follow-Up: At [facility], you and your health needs are our priority.  As part of our continuing mission to provide you with exceptional heart care, we have created designated Provider Care Teams.  These Care Teams include your primary Cardiologist (physician) and Advanced Practice Providers (APPs -  Physician Assistants and Nurse Practitioners) who all work together to provide you with the care you need, when you need it.  We recommend signing up for the patient portal called "MyChart".  Sign up information is provided on this After Visit Summary.  MyChart is used to connect with patients for Virtual Visits (Telemedicine).  Patients are able to view lab/test results, encounter notes, upcoming appointments, etc.  Non-urgent messages can be sent to your provider as well.   To learn more about what you can do with MyChart, go to [email or web address].    Your next appointment:   4 MONTHS WITH DR Randall An Stumpf,acting as a scribe for  Buford Dresser, MD.,have documented all relevant documentation on the behalf of Buford Dresser, MD,as directed by  Buford Dresser, MD while in the presence of Buford Dresser, MD.  I, Buford Dresser, MD, have reviewed all documentation for this visit. The documentation on 02/05/22 for the exam, diagnosis, procedures, and orders are all accurate and complete.   Signed, Buford Dresser, MD PhD 02/05/2022    [place]

## 2022-02-05 NOTE — Patient Instructions (Addendum)
Medication Instructions:  Your Physician recommend you continue on your current medication as directed.    *If you need a refill on your cardiac medications before your next appointment, please call your pharmacy*  Lab Work: NONE   Testing/Procedures: NONE  Follow-Up: At [facility], you and your health needs are our priority.  As part of our continuing mission to provide you with exceptional heart care, we have created designated Provider Care Teams.  These Care Teams include your primary Cardiologist (physician) and Advanced Practice Providers (APPs -  Physician Assistants and Nurse Practitioners) who all work together to provide you with the care you need, when you need it.  We recommend signing up for the patient portal called "MyChart".  Sign up information is provided on this After Visit Summary.  MyChart is used to connect with patients for Virtual Visits (Telemedicine).  Patients are able to view lab/test results, encounter notes, upcoming appointments, etc.  Non-urgent messages can be sent to your provider as well.   To learn more about what you can do with MyChart, go to [email or web address].    Your next appointment:   [place]

## 2022-03-19 ENCOUNTER — Other Ambulatory Visit: Payer: Self-pay | Admitting: *Deleted

## 2022-03-19 MED ORDER — ATORVASTATIN CALCIUM 80 MG PO TABS: 80.0000 mg | ORAL_TABLET | Freq: Every day | ORAL | 1 refills | Status: DC

## 2022-04-06 ENCOUNTER — Telehealth (HOSPITAL_COMMUNITY): Payer: Self-pay | Admitting: Emergency Medicine

## 2022-04-06 NOTE — Telephone Encounter (Signed)
Reaching out to patient to offer assistance regarding upcoming cardiac imaging study; pt verbalizes understanding of appt date/time, parking situation and where to check in, pre-test NPO status and medications ordered, and verified current allergies; name and call back number provided for further questions should they arise Rockwell Alexandria RN Navigator Cardiac Imaging [facility] [phone] office [phone] cell  Arrival 900 Daily meds except ntg, no caffeine, no food

## 2022-04-10 ENCOUNTER — Encounter (HOSPITAL_COMMUNITY)
Admission: RE | Admit: 2022-04-10 | Discharge: 2022-04-10 | Disposition: A | Discharge status: Home / Self Care | Payer: BC Managed Care – PPO | Source: Ambulatory Visit | Attending: Cardiology | Admitting: Cardiology

## 2022-04-10 DIAGNOSIS — I2510 Atherosclerotic heart disease of native coronary artery without angina pectoris: Secondary | ICD-10-CM | POA: Diagnosis not present

## 2022-04-10 DIAGNOSIS — R079 Chest pain, unspecified: Secondary | ICD-10-CM | POA: Diagnosis not present

## 2022-04-10 LAB — NM PET CT CARDIAC PERFUSION MULTI W/ABSOLUTE BLOODFLOW
MBFR: 3.42
Nuc Rest EF: 55 %
Nuc Stress EF: 69 %
Rest MBF: 0.66 ml/g/min
ST Depression (mm): 0 mm
Stress MBF: 2.26 ml/g/min
TID: 1.06

## 2022-04-10 MED ORDER — RUBIDIUM RB82 GENERATOR (RUBYFILL)
25.0000 | PACK | Freq: Once | INTRAVENOUS | Status: AC
  Administered 2022-04-10: 22.7 via INTRAVENOUS

## 2022-04-10 MED ORDER — REGADENOSON 0.4 MG/5ML IV SOLN
INTRAVENOUS | Status: AC
  Administered 2022-04-10: 0.4 mg via INTRAVENOUS
  Filled 2022-04-10: qty 5

## 2022-04-10 MED ORDER — REGADENOSON 0.4 MG/5ML IV SOLN: 0.4000 mg | Freq: Once | INTRAVENOUS | Status: AC

## 2022-04-10 MED ORDER — RUBIDIUM RB82 GENERATOR (RUBYFILL)
25.0000 | PACK | Freq: Once | INTRAVENOUS | Status: AC
  Administered 2022-04-10: 22.8 via INTRAVENOUS

## 2022-04-21 DIAGNOSIS — Z23 Encounter for immunization: Secondary | ICD-10-CM | POA: Diagnosis not present

## 2022-06-08 ENCOUNTER — Encounter (HOSPITAL_BASED_OUTPATIENT_CLINIC_OR_DEPARTMENT_OTHER): Payer: Self-pay | Admitting: Cardiology

## 2022-06-08 ENCOUNTER — Ambulatory Visit (INDEPENDENT_AMBULATORY_CARE_PROVIDER_SITE_OTHER): Payer: BC Managed Care – PPO | Admitting: Cardiology

## 2022-06-08 VITALS — BP 100/62 | HR 67 | Ht 69.0 in | Wt 187.8 lb

## 2022-06-08 DIAGNOSIS — Z8249 Family history of ischemic heart disease and other diseases of the circulatory system: Secondary | ICD-10-CM

## 2022-06-08 DIAGNOSIS — I2510 Atherosclerotic heart disease of native coronary artery without angina pectoris: Secondary | ICD-10-CM

## 2022-06-08 DIAGNOSIS — Z955 Presence of coronary angioplasty implant and graft: Secondary | ICD-10-CM

## 2022-06-08 DIAGNOSIS — E7800 Pure hypercholesterolemia, unspecified: Secondary | ICD-10-CM | POA: Diagnosis not present

## 2022-06-08 NOTE — Patient Instructions (Signed)
Medication Instructions:  Continue current medications  *If you need a refill on your cardiac medications before your next appointment, please call your pharmacy*   Lab Work: Fasting lipid today   Testing/Procedures: None Ordered   Follow-Up: At [facility], you and your health needs are our priority.  As part of our continuing mission to provide you with exceptional heart care, we have created designated Provider Care Teams.  These Care Teams include your primary Cardiologist (physician) and Advanced Practice Providers (APPs -  Physician Assistants and Nurse Practitioners) who all work together to provide you with the care you need, when you need it.  We recommend signing up for the patient portal called "MyChart".  Sign up information is provided on this After Visit Summary.  MyChart is used to connect with patients for Virtual Visits (Telemedicine).  Patients are able to view lab/test results, encounter notes, upcoming appointments, etc.  Non-urgent messages can be sent to your provider as well.   To learn more about what you can do with MyChart, go to [email or web address].    Your next appointment:   1 year(s)  Provider:   Buford Dresser, MD    Other Instructions

## 2022-06-08 NOTE — Progress Notes (Signed)
Cardiology Office Note:    Date:  06/14/2022   ID:  Allen Burns, DOB 29-Jul-1960, MRN [id number]  PCP:  Hali Marry, MD  Cardiologist:  Buford Dresser, MD  Referring MD: Hali Marry, *   CC: follow up  History of Present Illness:    Allen Burns is a 62 y.o. male with a hx of erectile dysfunction, former tobacco use, family history of heart disease, CAD s/p PCI 02/23/20 who is seen for follow up today. I initially met him 01/18/20 as a new consult at the request of Hali Marry, * for the evaluation and management of exertional chest discomfort.  He called the office 08/21/2021 reporting intermittent chest pain over the prior month. Also had shortness of breath while working in the yard. He reported progression of his pain to a daily left chest/shoulder pain with occasional LUE radiation distally. He could feel his pain when pressing into the area, but it didn't seem to actually change. While running for exercise or performing yoga he was unable to reproduce the chest pain. After shared decision making we discussed cardiac PET which was completed 04/10/2022 and revealed no evidence of decreased blood flow with normal heart function.  Today, he reports no recurrence of his prior chest pain concerns. He has soreness attributable to shoveling clay to widen his driveway, but nothing unexpected. He does take breaks as needed. For exercise he still completes hot yoga, and he is running. During hot yoga he feels mildly lightheaded and short of breath, but he is pushing himself.   He has lost about 5 lbs since his last visit. He continues to work on dietary changes. He has stopped sugar intake and stopped drinking cappuccinos. He started fish oil supplements and is otherwise tolerating his medications.  He denies any palpitations, chest pain, or peripheral edema. No headaches, syncope, orthopnea, or PND.   Past Medical History:  Diagnosis Date   Asthma    Erectile  dysfunction    Hyperlipidemia     Past Surgical History:  Procedure Laterality Date   CORONARY STENT INTERVENTION N/A 02/23/2020   Procedure: CORONARY STENT INTERVENTION;  Surgeon: Martinique, Peter M, MD;  Location: [facility];  Service: Cardiovascular;  Laterality: N/A;   INTRAVASCULAR ULTRASOUND/IVUS N/A 02/23/2020   Procedure: Intravascular Ultrasound/IVUS;  Surgeon: Martinique, Peter M, MD;  Location: [facility];  Service: Cardiovascular;  Laterality: N/A;   LEFT HEART CATH AND CORONARY ANGIOGRAPHY N/A 02/23/2020   Procedure: LEFT HEART CATH AND CORONARY ANGIOGRAPHY;  Surgeon: Martinique, Peter M, MD;  Location: [facility];  Service: Cardiovascular;  Laterality: N/A;   UMBILICAL HERNIA REPAIR  1975    Current Medications: Current Outpatient Medications on File Prior to Visit  Medication Sig   aspirin EC 81 MG tablet Take 81 mg by mouth daily. Swallow whole.   atorvastatin (LIPITOR) 80 MG tablet Take 1 tablet (80 mg total) by mouth daily.   Multiple Vitamins-Minerals (MULTI FOR HIM 50+) TABS 1 tablet   nitroGLYCERIN (NITROSTAT) 0.4 MG SL tablet PLACE 1 TABLET (0.4 MG TOTAL) UNDER THE TONGUE EVERY FIVE MINUTES AS NEEDED FOR CHEST PAIN.   No current facility-administered medications on file prior to visit.     Allergies:   Patient has no known allergies.   Social History   Tobacco Use   Smoking status: Former    Packs/day: 1.00    Years: 27.00    Total pack years: 27.00    Types: Cigarettes  Quit date: 09/05/1993    Years since quitting: 28.7   Smokeless tobacco: Never  Substance Use Topics   Alcohol use: Not Currently    Comment: 27 years ago   Drug use: Not Currently    Types: Marijuana    Comment: 27 years ago    Family History: family history includes Breast cancer in his father; CAD in his mother; Cancer in his mother and sister; Colon cancer in his sister; Heart disease in his father; Lung cancer in his father.  ROS:   Please see the history of  present illness.   (+) Lightheadedness (+) Shortness of breath (+) Myalgias Additional pertinent ROS otherwise unremarkable.    EKGs/Labs/Other Studies Reviewed:    The following studies were reviewed today:  Cardiac PET  04/10/2022:   The study is normal. The study is low risk.   LV perfusion is normal.   Rest left ventricular function is normal. Rest EF: 55 %. Stress left ventricular function is normal. Stress EF: 69 %. End diastolic cavity size is normal. End systolic cavity size is normal.   Myocardial blood flow was computed to be 0.65m/g/min at rest and 2.253mg/min at stress. Global myocardial blood flow reserve was 3.42 and was normal.   Coronary calcium assessment not performed due to prior revascularization.   Electronically signed: HeGwyndolyn KaufmanCath 02/23/20 Prox LAD to Mid LAD lesion is 40% stenosed. Mid Cx lesion is 90% stenosed. Post intervention, there is a 0% residual stenosis. A drug-eluting stent was successfully placed using a STENT RESOLUTE ONYX 3.0X18. The left ventricular systolic function is normal. LV end diastolic pressure is normal. The left ventricular ejection fraction is 55-65% by visual estimate.   1. Single vessel obstructive CAD 2. Normal LV function 3. Normal LVEDP 4. Successful PCI of the mid LCx with DES x 1 and IVUS guidance.   Plan: DAPT for one year. Anticipate same day DC.   Diagnostic: Dominance: Right   Intervention:    CTA with FFR 02/09/20 Coronary Arteries:  Normal coronary origin.  Right dominance.   RCA is a large dominant artery that gives rise to PDA and PLVB. There is no plaque.   Left main is a large artery that gives rise to LAD and LCX arteries. There is no plaque.   LAD is a large vessel that gives rise to a small D1 and a large branching D2. There is minimal calcified plaque in the proximal LAD with associated stenosis of 0-24%. There is moderate mixed plaque in the mid LAD with associated stenosis of  50-69%.   LCX is a non-dominant artery that gives rise to a small OM1 branch and large OM2 branch. There is mixed calcified plaque in the mid LCx with associated stenosis of 50-69% but possibly > 70%.   Other findings:   Normal pulmonary vein drainage into the left atrium.   Normal let atrial appendage without a thrombus.   Normal size of the pulmonary artery.   IMPRESSION: 1. Coronary calcium score of 127. This was 81st percentile for age and sex matched control.   2.  Normal coronary origin with right dominance.   3.  Moderate atherosclerosis.  CAD RADS 3.   4. Consider symptom-guided anti-ischemic and preventive pharmacotherapy as well as risk factor modification per guideline-directed care.   5.  This study has been submitted for FFR flow analysis.  FFR: 1. Left Main: No significant stenosis.  LM FFR = 0.99.   2. LAD: NO significant stenosis. Proximal FFR =  0.97, mid FFR = 0.80, Distal FFR = 0.72.   3. LCX: Possible flow limiting lesion in proximal to mid LCx. Proximal FFR = 1.00, Mid FFR = 0.61, Distal FFR = 0.52.   4. Ramus: No significant stenosis. Proximal FFR =, Mid FFR=, Distal FFR =.   5. RCA: No significant stenosis. Proximal FFR = 0.99, Mid FFR = 0.96, Distal FFR = 0.93.   IMPRESSION: 1. Coronary CT FFR flow analysis demonstrates possible flow limiting lesion in the proximal to mid LCx and gradual decrease in flow in LAD likely related to small vessel disease.   2.  Recommend cardiac catheterization.   EKG:  EKG is personally reviewed.   06/08/2022:  EKG was not ordered. 02/05/2022:  not ordered 09/01/2021: sinus bradycardia at 55 bpm, early repol 02/22/2021: sinus bradycardia at 57 bpm, early repol 04/07/20: sinus bradycardia at 58 bpm  Recent Labs: 12/14/2021: Hemoglobin 14.3; Platelets 257 06/13/2022: ALT 15; BUN 16; Creat 1.01; Potassium 4.6; Sodium 141   Recent Lipid Panel    Component Value Date/Time   CHOL 106 06/08/2022 0903   TRIG 72  06/08/2022 0903   HDL 45 06/08/2022 0903   CHOLHDL 2.4 06/08/2022 0903   CHOLHDL 2.4 06/12/2021 0000   LDLCALC 46 06/08/2022 0903   LDLCALC 62 06/12/2021 0000    Physical Exam:    VS:  BP 100/62 (BP Location: Right Arm, Patient Position: Sitting, Cuff Size: Normal)   Pulse 67   Ht '5\' 9"'$  (1.753 m)   Wt 187 lb 12.8 oz (85.2 kg)   SpO2 99%   BMI 27.73 kg/m     Wt Readings from Last 3 Encounters:  06/13/22 189 lb (85.7 kg)  06/08/22 187 lb 12.8 oz (85.2 kg)  02/05/22 194 lb 11.2 oz (88.3 kg)    GEN: Well nourished, well developed in no acute distress HEENT: Normal, moist mucous membranes NECK: No JVD CARDIAC: regular rhythm, normal S1 and S2, no rubs or gallops. No murmur. VASCULAR: Radial and DP pulses 2+ bilaterally. No carotid bruits RESPIRATORY:  Clear to auscultation without rales, wheezing or rhonchi  ABDOMEN: Soft, non-tender, non-distended MUSCULOSKELETAL:  Ambulates independently SKIN: Warm and dry, no edema NEUROLOGIC:  Alert and oriented x 3. No focal neuro deficits noted. PSYCHIATRIC:  Normal affect   ASSESSMENT:    1. Coronary artery disease involving native coronary artery of native heart without angina pectoris   2. Pure hypercholesterolemia   3. Status post coronary artery stent placement   4. Family history of heart disease     PLAN:    CAD s/p PCI 02/2020 Hypercholesterolemia -diagnosed by CT cardiac, confirmed on cath -chest pain now resolved, but had cardiac PET when occurring, this was low risk -Aspirin 81 mg long term, completed 1 year of ticagrelor -statin: atorvastatin 80 mg daily -LDL goal <70, check lipids today -antianginals: stopped beta blocker, has PRN nitro -counseled on diet recommendations and AHA exercise guidelines, below -risk factor modification, as below -counseled on red flag warning signs that need immediate medical attention.   Family history of heart disease Cardiac risk counseling and prevention  recommendations: -recommend heart healthy/Mediterranean diet, with whole grains, fruits, vegetable, fish, lean meats, nuts, and olive oil. Limit salt. -recommend moderate walking, 3-5 times/week for 30-50 minutes each session. Aim for at least 150 minutes.week. Goal should be pace of 3 miles/hours, or walking 1.5 miles in 30 minutes -recommend avoidance of tobacco products. Avoid excess alcohol.  Plan for follow up: 1 year or sooner as needed.  Buford Dresser, MD, PhD [place]  [facility]    Medication Adjustments/Labs and Tests Ordered: Current medicines are reviewed at length with the patient today.  Concerns regarding medicines are outlined above.   Orders Placed This Encounter  Procedures   Lipid panel   No orders of the defined types were placed in this encounter.  Patient Instructions  Medication Instructions:  Continue current medications  *If you need a refill on your cardiac medications before your next appointment, please call your pharmacy*   Lab Work: Fasting lipid today   Testing/Procedures: None Ordered   Follow-Up: At [facility], you and your health needs are our priority.  As part of our continuing mission to provide you with exceptional heart care, we have created designated Provider Care Teams.  These Care Teams include your primary Cardiologist (physician) and Advanced Practice Providers (APPs -  Physician Assistants and Nurse Practitioners) who all work together to provide you with the care you need, when you need it.  We recommend signing up for the patient portal called "MyChart".  Sign up information is provided on this After Visit Summary.  MyChart is used to connect with patients for Virtual Visits (Telemedicine).  Patients are able to view lab/test results, encounter notes, upcoming appointments, etc.  Non-urgent messages can be sent to your provider as well.   To learn more about what you can do with MyChart, go to  [email or web address].    Your next appointment:   1 year(s)  Provider:   Buford Dresser, MD    Other Instructions     I,Mathew Stumpf,acting as a scribe for Buford Dresser, MD.,have documented all relevant documentation on the behalf of Buford Dresser, MD,as directed by  Buford Dresser, MD while in the presence of Buford Dresser, MD.  I, Buford Dresser, MD, have reviewed all documentation for this visit. The documentation on 06/14/22 for the exam, diagnosis, procedures, and orders are all accurate and complete.   Signed, Buford Dresser, MD PhD 06/14/2022    [place]

## 2022-06-09 LAB — LIPID PANEL
Chol/HDL Ratio: 2.4 ratio (ref 0.0–5.0)
Cholesterol, Total: 106 mg/dL (ref 100–199)
HDL: 45 mg/dL (ref >39)
LDL Chol Calc (NIH): 46 mg/dL (ref 0–99)
Triglycerides: 72 mg/dL (ref 0–149)
VLDL Cholesterol Cal: 15 mg/dL (ref 5–40)

## 2022-06-13 ENCOUNTER — Ambulatory Visit (INDEPENDENT_AMBULATORY_CARE_PROVIDER_SITE_OTHER): Payer: BC Managed Care – PPO | Admitting: Family Medicine

## 2022-06-13 ENCOUNTER — Encounter: Payer: Self-pay | Admitting: Family Medicine

## 2022-06-13 VITALS — BP 109/84 | HR 72 | Ht 69.0 in | Wt 189.0 lb

## 2022-06-13 DIAGNOSIS — F4322 Adjustment disorder with anxiety: Secondary | ICD-10-CM | POA: Diagnosis not present

## 2022-06-13 DIAGNOSIS — Z0000 Encounter for general adult medical examination without abnormal findings: Secondary | ICD-10-CM | POA: Diagnosis not present

## 2022-06-13 NOTE — Progress Notes (Signed)
Complete physical exam  Patient: Allen Burns   DOB: 1960/04/28   62 y.o. Male  MRN: [id number]  Subjective:    Chief Complaint  Patient presents with   Annual Exam    Pt had had his lipid panel done on 06/08/22 with Cardiology. No other labs obtained. Last cmp done 11/2021    Allen Burns is a 62 y.o. male who presents today for a complete physical exam. He reports consuming a general diet.  Exercises regularly   He generally feels well. He reports sleeping well. He does not have additional problems to discuss today.    Most recent fall risk assessment:    06/13/2022    8:47 AM  Fall Risk   Falls in the past year? 0  Number falls in past yr: 0  Injury with Fall? 0  Risk for fall due to : Other (Comment)  Follow up Falls evaluation completed     Most recent depression screenings:    06/13/2022    8:47 AM 06/12/2021    8:11 AM  PHQ 2/9 Scores  PHQ - 2 Score 0 0        Patient Care Team: Hali Marry, MD as PCP - General (Family Medicine) Buford Dresser, MD as PCP - Cardiology (Cardiology)   Outpatient Medications Prior to Visit  Medication Sig   aspirin EC 81 MG tablet Take 81 mg by mouth daily. Swallow whole.   atorvastatin (LIPITOR) 80 MG tablet Take 1 tablet (80 mg total) by mouth daily.   Multiple Vitamins-Minerals (MULTI FOR HIM 50+) TABS 1 tablet   nitroGLYCERIN (NITROSTAT) 0.4 MG SL tablet PLACE 1 TABLET (0.4 MG TOTAL) UNDER THE TONGUE EVERY FIVE MINUTES AS NEEDED FOR CHEST PAIN.   Omega-3 Fatty Acids (FISH OIL PO) Take 2 capsules by mouth daily.   No facility-administered medications prior to visit.    ROS        Objective:     BP 109/84   Pulse 72   Ht '5\' 9"'$  (1.753 m)   Wt 189 lb (85.7 kg)   SpO2 97%   BMI 27.91 kg/m    Physical Exam Constitutional:      Appearance: He is well-developed.  HENT:     Head: Normocephalic and atraumatic.     Right Ear: Tympanic membrane, ear canal and external ear normal.     Left Ear:  Tympanic membrane, ear canal and external ear normal.     Nose: Nose normal.     Mouth/Throat:     Pharynx: Oropharynx is clear.  Eyes:     Conjunctiva/sclera: Conjunctivae normal.     Pupils: Pupils are equal, round, and reactive to light.  Neck:     Thyroid: No thyromegaly.  Cardiovascular:     Rate and Rhythm: Normal rate and regular rhythm.     Heart sounds: Normal heart sounds.  Pulmonary:     Effort: Pulmonary effort is normal.     Breath sounds: Normal breath sounds.  Abdominal:     General: Bowel sounds are normal. There is no distension.     Palpations: Abdomen is soft. There is no mass.     Tenderness: There is no abdominal tenderness. There is no guarding or rebound.  Musculoskeletal:        General: Normal range of motion.     Cervical back: Normal range of motion and neck supple. No tenderness.  Lymphadenopathy:     Cervical: No cervical adenopathy.  Skin:    General:  Skin is warm and dry.  Neurological:     Mental Status: He is alert and oriented to person, place, and time.     Deep Tendon Reflexes: Reflexes are normal and symmetric.  Psychiatric:        Behavior: Behavior normal.        Thought Content: Thought content normal.        Judgment: Judgment normal.      No results found for any visits on 06/13/22.     Assessment & Plan:    Routine Health Maintenance and Physical Exam  Immunization History  Administered Date(s) Administered   Influenza Split 12/19/2018   PFIZER(Purple Top)SARS-COV-2 Vaccination 06/19/2019, 07/22/2019, 02/12/2020   PPD Test 08/06/2017, 10/31/2020   Tdap 06/18/2014   Zoster Recombinat (Shingrix) 12/29/2018, 08/14/2019    Health Maintenance  Topic Date Due   HIV Screening  Never done   COVID-19 Vaccine (4 - 2023-24 season) 12/15/2021   DTaP/Tdap/Td (2 - Td or Tdap) 06/17/2024   COLONOSCOPY (Pts 45-60yr Insurance coverage will need to be confirmed)  11/26/2024   INFLUENZA VACCINE  Completed   Hepatitis C Screening   Completed   Zoster Vaccines- Shingrix  Completed   Pneumococcal Vaccine [id number] old  Aged Out   HPV VACCINES  Aged Out    Discussed health benefits of physical activity, and encouraged him to engage in regular exercise appropriate for his age and condition.  Problem List Items Addressed This Visit   None Visit Diagnoses     Wellness examination    -  Primary   Relevant Orders   COMPLETE METABOLIC PANEL WITH GFR   PSA       Keep up a regular exercise program and make sure you are eating a healthy diet Try to eat 4 servings of dairy a day, or if you are lactose intolerant take a calcium with vitamin D daily.  Your vaccines are up to date.   No follow-ups on file.     CBeatrice Lecher MD

## 2022-06-14 ENCOUNTER — Encounter (HOSPITAL_BASED_OUTPATIENT_CLINIC_OR_DEPARTMENT_OTHER): Payer: Self-pay | Admitting: Cardiology

## 2022-06-14 LAB — COMPLETE METABOLIC PANEL WITH GFR
AG Ratio: 2.2 (calc) (ref 1.0–2.5)
ALT: 15 U/L (ref 9–46)
AST: 19 U/L (ref 10–35)
Albumin: 4.6 g/dL (ref 3.6–5.1)
Alkaline phosphatase (APISO): 63 U/L (ref 35–144)
BUN: 16 mg/dL (ref 7–25)
CO2: 29 mmol/L (ref 20–32)
Calcium: 9.6 mg/dL (ref 8.6–10.3)
Chloride: 105 mmol/L (ref 98–110)
Creat: 1.01 mg/dL (ref 0.70–1.30)
Globulin: 2.1 g/dL (calc) (ref 1.9–3.7)
Glucose, Bld: 84 mg/dL (ref 65–99)
Potassium: 4.6 mmol/L (ref 3.5–5.3)
Sodium: 141 mmol/L (ref 135–146)
Total Bilirubin: 1.6 mg/dL — ABNORMAL HIGH (ref 0.2–1.2)
Total Protein: 6.7 g/dL (ref 6.1–8.1)
eGFR: 86 mL/min/{1.73_m2} (ref >=60)

## 2022-06-14 LAB — PSA: PSA: 0.55 ng/mL (ref <=4.00)

## 2022-06-14 NOTE — Progress Notes (Signed)
Hi Yisrael, repeat bilirubin is still borderline elevated but better than it was 6 months ago it is down to 1.6.  All other liver enzymes are normal.  Prostate test is normal.

## 2022-07-11 ENCOUNTER — Ambulatory Visit (INDEPENDENT_AMBULATORY_CARE_PROVIDER_SITE_OTHER): Payer: BC Managed Care – PPO | Admitting: Family Medicine

## 2022-07-11 DIAGNOSIS — H6123 Impacted cerumen, bilateral: Secondary | ICD-10-CM

## 2022-07-11 DIAGNOSIS — R6882 Decreased libido: Secondary | ICD-10-CM | POA: Diagnosis not present

## 2022-07-11 DIAGNOSIS — H9193 Unspecified hearing loss, bilateral: Secondary | ICD-10-CM

## 2022-07-11 NOTE — Progress Notes (Signed)
See note below   Androgen Deficiency in the Aging Male     [place] Name 07/11/22 1200         Androgen Deficiency in the Aging Male   Do you have a decrease in libido (sex drive) Yes     Do you have lack of energy No     Do you have a decrease in strength and/or endurance No     Have you lost height Yes     Have you noticed a decreased "enjoyment of life" No     Are you sad and/or grumpy No     Are your erections less strong Yes     Have you noticed a recent deterioration in your ability to play sports No     Are you falling asleep after dinner No     Has there been a recent deterioration in your work performance No

## 2022-07-11 NOTE — Progress Notes (Signed)
   Established Patient Office Visit  Subjective   Patient ID: Allen Burns, male    DOB: 1961-04-07  Age: 62 y.o. MRN: [id number]  No chief complaint on file.   HPI  Allen Burns is here for bilateral ear wax removal.  Has noted some decreased hearing but also has history of loud music exposure.  He also wanted to ask about possibly getting tested for testosterone deficiency now that he is [place] 35.  He did complete the Adam questionnaire today and scored 3 out of 10 he did report decrease in libido, loss in height and less strong erections.  Otherwise everything else was normal.  ROS    Objective:     There were no vitals taken for this visit.   Physical Exam   No results found for any visits on 07/11/22.    The ASCVD Risk score (Arnett DK, et al., 2019) failed to calculate for the following reasons:   The valid total cholesterol range is 130 to 320 mg/dL    Assessment & Plan:  Bilateral impacted cerumen - Cleared from wax.   Problem List Items Addressed This Visit   None Visit Diagnoses     Bilateral impacted cerumen    -  Primary   Relevant Orders   Ear cerumen removal   Decreased libido         Indication: Cerumen impaction of the ear(s) Medical necessity statement: On physical examination, cerumen impairs clinically significant portions of the external auditory canal, and tympanic membrane. Noted obstructive, copious cerumen that cannot be removed without magnification and instrumentations  Consent: Discussed benefits and risks of procedure and verbal consent obtained Procedure: Patient was prepped for the procedure. Utilized an otoscope to assess and take note of the ear canal, the tympanic membrane, and the presence, amount, and placement of the cerumen. Gentle water irrigation and soft plastic curette was utilized to remove cerumen.  Post procedure examination: shows cerumen was completely removed. Patient tolerated procedure well. The patient is made aware that  they may experience temporary vertigo, temporary hearing loss, and temporary discomfort. If these symptom last for more than 24 hours to call the clinic or proceed to the ED.    Decreased hearing-we also discussed referral for audiology at some point in the future if he would like.  We did discuss the possibility of testing testosterone levels he only checked yes to 3 of the questions but if at some point he would like to do so we can always check a morning level but the only point in checking would be if he is very interested in replacement therapy and that is only if he is truly deficient.  Will continue to monitor symptoms.  No follow-ups on file.    Allen Lecher, MD

## 2022-07-24 ENCOUNTER — Encounter: Payer: Self-pay | Admitting: Cardiology

## 2022-07-24 NOTE — Telephone Encounter (Signed)
Error

## 2022-08-22 DIAGNOSIS — F4322 Adjustment disorder with anxiety: Secondary | ICD-10-CM | POA: Diagnosis not present

## 2022-09-05 DIAGNOSIS — F4389 Other reactions to severe stress: Secondary | ICD-10-CM | POA: Diagnosis not present

## 2022-09-05 DIAGNOSIS — F4322 Adjustment disorder with anxiety: Secondary | ICD-10-CM | POA: Diagnosis not present

## 2022-09-07 DIAGNOSIS — L814 Other melanin hyperpigmentation: Secondary | ICD-10-CM | POA: Diagnosis not present

## 2022-09-07 DIAGNOSIS — D2239 Melanocytic nevi of other parts of face: Secondary | ICD-10-CM | POA: Diagnosis not present

## 2022-09-07 DIAGNOSIS — L821 Other seborrheic keratosis: Secondary | ICD-10-CM | POA: Diagnosis not present

## 2022-09-07 DIAGNOSIS — L400 Psoriasis vulgaris: Secondary | ICD-10-CM | POA: Diagnosis not present

## 2022-09-13 ENCOUNTER — Other Ambulatory Visit: Payer: Self-pay | Admitting: Cardiology

## 2022-09-13 NOTE — Telephone Encounter (Signed)
Rx request sent to pharmacy.  

## 2022-09-19 DIAGNOSIS — F4389 Other reactions to severe stress: Secondary | ICD-10-CM | POA: Diagnosis not present

## 2022-09-20 DIAGNOSIS — F4322 Adjustment disorder with anxiety: Secondary | ICD-10-CM | POA: Diagnosis not present

## 2022-09-26 DIAGNOSIS — F4389 Other reactions to severe stress: Secondary | ICD-10-CM | POA: Diagnosis not present

## 2022-10-03 DIAGNOSIS — F4322 Adjustment disorder with anxiety: Secondary | ICD-10-CM | POA: Diagnosis not present

## 2022-10-09 DIAGNOSIS — F4389 Other reactions to severe stress: Secondary | ICD-10-CM | POA: Diagnosis not present

## 2022-10-17 DIAGNOSIS — M722 Plantar fascial fibromatosis: Secondary | ICD-10-CM | POA: Diagnosis not present

## 2022-10-17 DIAGNOSIS — F4389 Other reactions to severe stress: Secondary | ICD-10-CM | POA: Diagnosis not present

## 2022-10-19 DIAGNOSIS — L400 Psoriasis vulgaris: Secondary | ICD-10-CM | POA: Diagnosis not present

## 2022-10-31 DIAGNOSIS — F4389 Other reactions to severe stress: Secondary | ICD-10-CM | POA: Diagnosis not present

## 2022-11-07 DIAGNOSIS — F4389 Other reactions to severe stress: Secondary | ICD-10-CM | POA: Diagnosis not present

## 2022-11-15 DIAGNOSIS — F4322 Adjustment disorder with anxiety: Secondary | ICD-10-CM | POA: Diagnosis not present

## 2022-11-28 DIAGNOSIS — F4389 Other reactions to severe stress: Secondary | ICD-10-CM | POA: Diagnosis not present

## 2022-12-12 ENCOUNTER — Ambulatory Visit (INDEPENDENT_AMBULATORY_CARE_PROVIDER_SITE_OTHER): Payer: BC Managed Care – PPO | Admitting: Family Medicine

## 2022-12-12 ENCOUNTER — Encounter: Payer: Self-pay | Admitting: *Deleted

## 2022-12-12 VITALS — Temp 98.4°F

## 2022-12-12 DIAGNOSIS — Z23 Encounter for immunization: Secondary | ICD-10-CM

## 2022-12-12 DIAGNOSIS — Z111 Encounter for screening for respiratory tuberculosis: Secondary | ICD-10-CM

## 2022-12-12 DIAGNOSIS — F4389 Other reactions to severe stress: Secondary | ICD-10-CM | POA: Diagnosis not present

## 2022-12-12 NOTE — Progress Notes (Signed)
Pt here to have ppd placed.   Ppd placed on L forearm w/o complication. He will RTC in 48 hours for reading.

## 2022-12-12 NOTE — Progress Notes (Signed)
Agree with documentation as above.   [name], MD  

## 2022-12-14 ENCOUNTER — Ambulatory Visit (INDEPENDENT_AMBULATORY_CARE_PROVIDER_SITE_OTHER): Payer: BC Managed Care – PPO | Admitting: Family Medicine

## 2022-12-14 VITALS — Ht 69.0 in | Wt 189.0 lb

## 2022-12-14 DIAGNOSIS — Z111 Encounter for screening for respiratory tuberculosis: Secondary | ICD-10-CM | POA: Diagnosis not present

## 2022-12-14 LAB — TB SKIN TEST
Induration: 0 mm
TB Skin Test: NEGATIVE

## 2022-12-14 NOTE — Progress Notes (Signed)
Agree with documentation as above.   [name], MD  

## 2022-12-14 NOTE — Progress Notes (Signed)
Pt presented for TB reading. Negative. 0 mm induration.  Printout of results given to patient.

## 2022-12-19 DIAGNOSIS — F4389 Other reactions to severe stress: Secondary | ICD-10-CM | POA: Diagnosis not present

## 2022-12-26 DIAGNOSIS — F4322 Adjustment disorder with anxiety: Secondary | ICD-10-CM | POA: Diagnosis not present

## 2022-12-26 DIAGNOSIS — F4389 Other reactions to severe stress: Secondary | ICD-10-CM | POA: Diagnosis not present

## 2023-01-16 DIAGNOSIS — F4389 Other reactions to severe stress: Secondary | ICD-10-CM | POA: Diagnosis not present

## 2023-01-22 DIAGNOSIS — F4389 Other reactions to severe stress: Secondary | ICD-10-CM | POA: Diagnosis not present

## 2023-01-23 DIAGNOSIS — F4322 Adjustment disorder with anxiety: Secondary | ICD-10-CM | POA: Diagnosis not present

## 2023-02-06 DIAGNOSIS — F4389 Other reactions to severe stress: Secondary | ICD-10-CM | POA: Diagnosis not present

## 2023-02-20 DIAGNOSIS — F4389 Other reactions to severe stress: Secondary | ICD-10-CM | POA: Diagnosis not present

## 2023-02-27 DIAGNOSIS — F4389 Other reactions to severe stress: Secondary | ICD-10-CM | POA: Diagnosis not present

## 2023-03-06 DIAGNOSIS — F4389 Other reactions to severe stress: Secondary | ICD-10-CM | POA: Diagnosis not present

## 2023-03-13 DIAGNOSIS — F4389 Other reactions to severe stress: Secondary | ICD-10-CM | POA: Diagnosis not present

## 2023-03-27 DIAGNOSIS — F4322 Adjustment disorder with anxiety: Secondary | ICD-10-CM | POA: Diagnosis not present

## 2023-04-03 DIAGNOSIS — F4389 Other reactions to severe stress: Secondary | ICD-10-CM | POA: Diagnosis not present

## 2023-04-24 DIAGNOSIS — F4322 Adjustment disorder with anxiety: Secondary | ICD-10-CM | POA: Diagnosis not present

## 2023-05-08 DIAGNOSIS — F4389 Other reactions to severe stress: Secondary | ICD-10-CM | POA: Diagnosis not present

## 2023-05-22 DIAGNOSIS — F4389 Other reactions to severe stress: Secondary | ICD-10-CM | POA: Diagnosis not present

## 2023-06-05 DIAGNOSIS — F4389 Other reactions to severe stress: Secondary | ICD-10-CM | POA: Diagnosis not present

## 2023-06-12 ENCOUNTER — Ambulatory Visit (HOSPITAL_BASED_OUTPATIENT_CLINIC_OR_DEPARTMENT_OTHER): Payer: BC Managed Care – PPO | Admitting: Cardiology

## 2023-06-12 ENCOUNTER — Encounter (HOSPITAL_BASED_OUTPATIENT_CLINIC_OR_DEPARTMENT_OTHER): Payer: Self-pay | Admitting: Cardiology

## 2023-06-12 VITALS — BP 122/76 | HR 74 | Ht 69.0 in | Wt 198.5 lb

## 2023-06-12 DIAGNOSIS — I2510 Atherosclerotic heart disease of native coronary artery without angina pectoris: Secondary | ICD-10-CM | POA: Diagnosis not present

## 2023-06-12 DIAGNOSIS — Z8249 Family history of ischemic heart disease and other diseases of the circulatory system: Secondary | ICD-10-CM

## 2023-06-12 DIAGNOSIS — G8929 Other chronic pain: Secondary | ICD-10-CM

## 2023-06-12 DIAGNOSIS — Z955 Presence of coronary angioplasty implant and graft: Secondary | ICD-10-CM | POA: Diagnosis not present

## 2023-06-12 DIAGNOSIS — E7800 Pure hypercholesterolemia, unspecified: Secondary | ICD-10-CM

## 2023-06-12 DIAGNOSIS — M25512 Pain in left shoulder: Secondary | ICD-10-CM

## 2023-06-12 MED ORDER — NITROGLYCERIN 0.4 MG SL SUBL: SUBLINGUAL_TABLET | SUBLINGUAL | 2 refills | Status: AC | PRN

## 2023-06-12 NOTE — Patient Instructions (Signed)
 Medication Instructions:  No changes *If you need a refill on your cardiac medications before your next appointment, please call your pharmacy*  Lab Work: No labs If you have labs (blood work) drawn today and your tests are completely normal, you will receive your results only by: MyChart Message (if you have MyChart) OR A paper copy in the mail If you have any lab test that is abnormal or we need to change your treatment, we will call you to review the results.  Testing/Procedures: No testing  Follow-Up: At [facility], you and your health needs are our priority.  As part of our continuing mission to provide you with exceptional heart care, we have created designated Provider Care Teams.  These Care Teams include your primary Cardiologist (physician) and Advanced Practice Providers (APPs -  Physician Assistants and Nurse Practitioners) who all work together to provide you with the care you need, when you need it.  We recommend signing up for the patient portal called "MyChart".  Sign up information is provided on this After Visit Summary.  MyChart is used to connect with patients for Virtual Visits (Telemedicine).  Patients are able to view lab/test results, encounter notes, upcoming appointments, etc.  Non-urgent messages can be sent to your provider as well.   To learn more about what you can do with MyChart, go to [email or web address].    Your next appointment:   1 year(s)  Provider:   Jodelle Red, MD

## 2023-06-12 NOTE — Progress Notes (Signed)
 Cardiology Office Note:  .   Date:  06/12/2023  ID:  Allen Burns, DOB [birth year], MRN [id number] PCP: Agapito Games, MD  [facility] Providers Cardiologist:  Jodelle Red, MD {  History of Present Illness: .   Allen Burns is a 63 y.o. male with a hx of erectile dysfunction, former tobacco use, family history of heart disease, CAD s/p PCI 02/23/20 who is seen for follow up today. I initially met him 01/18/20 as a new consult at the request of Agapito Games, * for the evaluation and management of exertional chest discomfort.   Pertinent CV history: Cardiac PET 03/2022 for intermittent left shoulder pain, no ischemia. Cath 2021 with PCI to mLCx, prox LAD 40% stenosis.  Today: Overall doing well, though notes intermittent left shoulder pain into the axilla. Lasts several days at a time. Can exercise through it, doesn't seen to worsen with exercise. No sharp, not tender, more of an ache. No associated shortness of breath, diaphoresis. Able to exercise, do hot yoga. Similar to symptoms he was having before the cardiac PET in 2023. Has not tried nitroglycerin.   ROS: Denies shortness of breath at rest or with normal exertion. No PND, orthopnea, LE edema or unexpected weight gain. No syncope or palpitations. ROS otherwise negative except as noted.   Studies Reviewed: Marland Kitchen    EKG:  EKG Interpretation Date/Time:  Wednesday June 12 2023 10:29:29 EST Ventricular Rate:  73 PR Interval:  166 QRS Duration:  74 QT Interval:  386 QTC Calculation: 425 R Axis:   81  Text Interpretation: Normal sinus rhythm Benign early repolarization Confirmed by Jodelle Red [phone]) on 06/12/2023 11:16:49 AM    Physical Exam:   VS:  BP 122/76 (BP Location: Right Arm, Patient Position: Sitting, Cuff Size: Normal)   Pulse 74   Ht 5\' 9"  (1.753 m)   Wt 198 lb 8 oz (90 kg)   SpO2 94%   BMI 29.31 kg/m    Wt Readings from Last 3 Encounters:  06/12/23 198 lb 8 oz (90 kg)   12/14/22 189 lb (85.7 kg)  06/13/22 189 lb (85.7 kg)    GEN: Well nourished, well developed in no acute distress HEENT: Normal, moist mucous membranes NECK: No JVD CARDIAC: regular rhythm, normal S1 and S2, no rubs or gallops. No murmur. VASCULAR: Radial and DP pulses 2+ bilaterally. No carotid bruits RESPIRATORY:  Clear to auscultation without rales, wheezing or rhonchi  ABDOMEN: Soft, non-tender, non-distended MUSCULOSKELETAL:  Ambulates independently. Muscle tightness/tenderness along left trapezius, left rotator cuff, and left pectoral with mild tenderness SKIN: Warm and dry, no edema NEUROLOGIC:  Alert and oriented x 3. No focal neuro deficits noted. PSYCHIATRIC:  Normal affect    ASSESSMENT AND PLAN: .    CAD s/p PCI 02/2020 Hypercholesterolemia Shoulder pain -diagnosed by CT cardiac, confirmed on cath -history of L shoulder pain, had cardiac PET when occurring, this was low risk. Has cervical spine disc issues, discussed potential etiologies of pain, discussed that if symptoms worsen he should contact me -Aspirin 81 mg long term, completed 1 year of ticagrelor -statin: atorvastatin 80 mg daily -LDL goal <70, last 46,  -antianginals: stopped beta blocker, has PRN nitroglycerin, has not required -counseled on diet recommendations and AHA exercise guidelines, below -risk factor modification, as below -counseled on red flag warning signs that need immediate medical attention.    Family history of heart disease CV risk counseling and prevention -recommend heart healthy/Mediterranean diet, with whole grains, fruits, vegetable, fish,  lean meats, nuts, and olive oil. Limit salt. -recommend moderate walking, 3-5 times/week for 30-50 minutes each session. Aim for at least 150 minutes.week. Goal should be pace of 3 miles/hours, or walking 1.5 miles in 30 minutes -recommend avoidance of tobacco products. Avoid excess alcohol.  Dispo: 1 year or sooner as needed  Signed, Jodelle Red, MD   Jodelle Red, MD, PhD, [facility]  [facility]  [place]  [facility] at [facility] at [facility] [address] [place], [place] [address] [phone]

## 2023-06-19 ENCOUNTER — Ambulatory Visit (INDEPENDENT_AMBULATORY_CARE_PROVIDER_SITE_OTHER): Payer: BC Managed Care – PPO | Admitting: Family Medicine

## 2023-06-19 ENCOUNTER — Encounter: Payer: Self-pay | Admitting: Family Medicine

## 2023-06-19 VITALS — BP 107/65 | HR 68 | Ht 69.0 in | Wt 196.0 lb

## 2023-06-19 DIAGNOSIS — R0789 Other chest pain: Secondary | ICD-10-CM | POA: Diagnosis not present

## 2023-06-19 DIAGNOSIS — Z23 Encounter for immunization: Secondary | ICD-10-CM

## 2023-06-19 DIAGNOSIS — L409 Psoriasis, unspecified: Secondary | ICD-10-CM | POA: Insufficient documentation

## 2023-06-19 DIAGNOSIS — F4322 Adjustment disorder with anxiety: Secondary | ICD-10-CM | POA: Diagnosis not present

## 2023-06-19 DIAGNOSIS — Z0000 Encounter for general adult medical examination without abnormal findings: Secondary | ICD-10-CM | POA: Diagnosis not present

## 2023-06-19 NOTE — Progress Notes (Signed)
 Complete physical exam  Patient: Allen Burns   DOB: 14-Feb-1961   63 y.o. Male  MRN: [id number]  Subjective:    Chief Complaint  Patient presents with   Annual Exam    Allen Burns is a 63 y.o. male who presents today for a complete physical exam. He reports consuming a general diet.  Exercise includes hot yoga  He generally feels well. He reports sleeping well. He does not have additional problems to discuss today.   He did see dermatology not too long ago and says he was diagnosed with either eczema or psoriasis and was given a topical steroid cream and then a second cream to use intermittently he says all usually just use the steroid for about 3 to 5 days if he gets a flare and then he will switch back to the other cream he also uses a moisturizer this seems to be working well for him.  He was seen at [facility] dermatology and cosmetic center   Struggles with some intermittent low back pain, which he treats conservatively.  More recently he has had some left-sided chest wall pain and thinks it could just be muscular.  Most recent fall risk assessment:    06/19/2023    9:27 AM  Fall Risk   Falls in the past year? 0  Number falls in past yr: 0  Injury with Fall? 0  Risk for fall due to : No Fall Risks  Follow up Falls evaluation completed     Most recent depression screenings:    06/19/2023    9:27 AM 06/13/2022    8:47 AM  PHQ 2/9 Scores  PHQ - 2 Score 0 0         Patient Care Team: Agapito Games, MD as PCP - General (Family Medicine) Jodelle Red, MD as PCP - Cardiology (Cardiology)   Outpatient Medications Prior to Visit  Medication Sig   aspirin EC 81 MG tablet Take 81 mg by mouth daily. Swallow whole.   atorvastatin (LIPITOR) 80 MG tablet TAKE 1 TABLET BY MOUTH DAILY   Multiple Vitamins-Minerals (MULTI FOR HIM 50+) TABS 1 tablet   nitroGLYCERIN (NITROSTAT) 0.4 MG SL tablet PLACE 1 TABLET (0.4 MG TOTAL) UNDER THE TONGUE EVERY FIVE MINUTES AS  NEEDED FOR CHEST PAIN.   Omega-3 Fatty Acids (FISH OIL PO) Take 2 capsules by mouth daily.   Roflumilast (ZORYVE) 0.3 % CREA Apply 0.3 % topically in the morning and at bedtime. Apply to the affected area two times daily for 2 weeks   triamcinolone ointment (KENALOG) 0.5 % Apply 1 Application topically 2 (two) times daily. Apply to the affected area two times daily for 2 weeks   No facility-administered medications prior to visit.    ROS        Objective:     BP 107/65   Pulse 68   Ht 5\' 9"  (1.753 m)   Wt 196 lb (88.9 kg)   SpO2 97%   BMI 28.94 kg/m     Physical Exam Vitals reviewed.  Constitutional:      Appearance: Normal appearance.  HENT:     Head: Normocephalic and atraumatic.     Right Ear: Tympanic membrane, ear canal and external ear normal. There is no impacted cerumen.     Left Ear: Tympanic membrane, ear canal and external ear normal. There is no impacted cerumen.     Nose: Nose normal.     Mouth/Throat:     Pharynx: Oropharynx is clear.  Eyes:     Extraocular Movements: Extraocular movements intact.     Conjunctiva/sclera: Conjunctivae normal.     Pupils: Pupils are equal, round, and reactive to light.  Neck:     Thyroid: No thyromegaly.  Cardiovascular:     Rate and Rhythm: Normal rate and regular rhythm.  Pulmonary:     Effort: Pulmonary effort is normal.     Breath sounds: Normal breath sounds.  Abdominal:     General: Bowel sounds are normal.     Palpations: Abdomen is soft.     Tenderness: There is no abdominal tenderness.  Musculoskeletal:        General: No swelling.     Cervical back: Neck supple. No tenderness.     Comments: Palpable tenderness and tightness over the pectoral muscle on the left chest wall.  Lymphadenopathy:     Cervical: No cervical adenopathy.  Skin:    General: Skin is warm and dry.  Neurological:     Mental Status: He is alert and oriented to person, place, and time.  Psychiatric:        Mood and Affect: Mood  normal.        Behavior: Behavior normal.      No results found for any visits on 06/19/23.      Assessment & Plan:    Routine Health Maintenance and Physical Exam  Immunization History  Administered Date(s) Administered   Influenza Split 12/19/2018   Influenza, Seasonal, Injecte, Preservative Fre 12/12/2022   Influenza,inj,Quad PF,6+ Mos 12/19/2018, 04/21/2022   PFIZER(Purple Top)SARS-COV-2 Vaccination 06/19/2019, 07/22/2019, 02/12/2020   PPD Test 08/06/2017, 10/31/2020, 12/12/2022   Pfizer(Comirnaty)Fall Seasonal Vaccine 12 years and older 06/19/2023   Tdap 06/18/2014   Zoster Recombinant(Shingrix) 12/29/2018, 08/14/2019    Health Maintenance  Topic Date Due   Pneumococcal Vaccine 19-53 Years old (1 of 2 - PCV) Never done   HIV Screening  Never done   DTaP/Tdap/Td (2 - Td or Tdap) 06/17/2024   Colonoscopy  11/26/2024   INFLUENZA VACCINE  Completed   COVID-19 Vaccine  Completed   Hepatitis C Screening  Completed   Zoster Vaccines- Shingrix  Completed   HPV VACCINES  Aged Out    Discussed health benefits of physical activity, and encouraged him to engage in regular exercise appropriate for his age and condition.  Problem List Items Addressed This Visit       Musculoskeletal and Integument   Psoriasis   Other Visit Diagnoses       Wellness examination    -  Primary   Relevant Orders   CMP14+EGFR   Lipid panel   CBC     Left-sided chest wall pain         Encounter for immunization       Relevant Orders   Pfizer Comirnaty Covid-19 Vaccine 3yrs & older (Completed)      Return in about 1 year (around 06/18/2024) for Wellness Exam.   Plan to call dermatology office for copy of recent office visit so that we can update his chart for his current diagnoses.  Do think that left-sided chest wall pain is muscular.  Continue to work on stretches heat or ice and if not improving please let us know.  Nani Gasser, MD

## 2023-06-20 ENCOUNTER — Encounter: Payer: Self-pay | Admitting: Family Medicine

## 2023-06-20 LAB — CMP14+EGFR
ALT: 17 IU/L (ref 0–44)
AST: 23 IU/L (ref 0–40)
Albumin: 4.5 g/dL (ref 3.8–4.9)
Alkaline Phosphatase: 91 IU/L (ref 44–121)
BUN/Creatinine Ratio: 18 (ref 10–24)
BUN: 16 mg/dL (ref 8–27)
Bilirubin Total: 0.9 mg/dL (ref 0.0–1.2)
CO2: 23 mmol/L (ref 20–29)
Calcium: 9.5 mg/dL (ref 8.6–10.2)
Chloride: 105 mmol/L (ref 96–106)
Creatinine, Ser: 0.87 mg/dL (ref 0.76–1.27)
Globulin, Total: 2.1 g/dL (ref 1.5–4.5)
Glucose: 92 mg/dL (ref 70–99)
Potassium: 4.6 mmol/L (ref 3.5–5.2)
Sodium: 144 mmol/L (ref 134–144)
Total Protein: 6.6 g/dL (ref 6.0–8.5)
eGFR: 99 mL/min/{1.73_m2} (ref >59)

## 2023-06-20 LAB — LIPID PANEL
Chol/HDL Ratio: 3.1 ratio (ref 0.0–5.0)
Cholesterol, Total: 129 mg/dL (ref 100–199)
HDL: 42 mg/dL (ref >39)
LDL Chol Calc (NIH): 73 mg/dL (ref 0–99)
Triglycerides: 71 mg/dL (ref 0–149)
VLDL Cholesterol Cal: 14 mg/dL (ref 5–40)

## 2023-06-20 LAB — CBC
Hematocrit: 47 % (ref 37.5–51.0)
Hemoglobin: 15.4 g/dL (ref 13.0–17.7)
MCH: 30 pg (ref 26.6–33.0)
MCHC: 32.8 g/dL (ref 31.5–35.7)
MCV: 91 fL (ref 79–97)
Platelets: 296 10*3/uL (ref 150–450)
RBC: 5.14 x10E6/uL (ref 4.14–5.80)
RDW: 12.4 % (ref 11.6–15.4)
WBC: 5.5 10*3/uL (ref 3.4–10.8)

## 2023-06-20 NOTE — Progress Notes (Signed)
 Your lab work is within acceptable range and there are no concerning findings.   ?

## 2023-07-03 DIAGNOSIS — F4389 Other reactions to severe stress: Secondary | ICD-10-CM | POA: Diagnosis not present

## 2023-07-18 ENCOUNTER — Telehealth: Payer: Self-pay | Admitting: Family Medicine

## 2023-07-18 NOTE — Telephone Encounter (Signed)
 Copied from CRM [id number]. Topic: General - Other >> Jul 18, 2023 12:55 PM Alvino Blood C wrote: Reason for CRM: Patient is currently seeing Dr. Eppie Gibson and would like to know if she would be willing to accept his niece as a patient as well. Allen Burns DOB: 02/12/2003 currently needs a wellness visit she will leaving for college in the fall.

## 2023-07-18 NOTE — Telephone Encounter (Signed)
 Yes, okay to schedule for physical for college.

## 2023-07-31 DIAGNOSIS — F4389 Other reactions to severe stress: Secondary | ICD-10-CM | POA: Diagnosis not present

## 2023-08-19 DIAGNOSIS — S39012A Strain of muscle, fascia and tendon of lower back, initial encounter: Secondary | ICD-10-CM | POA: Diagnosis not present

## 2023-08-21 DIAGNOSIS — F4322 Adjustment disorder with anxiety: Secondary | ICD-10-CM | POA: Diagnosis not present

## 2023-08-23 DIAGNOSIS — M5126 Other intervertebral disc displacement, lumbar region: Secondary | ICD-10-CM | POA: Diagnosis not present

## 2023-09-06 DIAGNOSIS — M5126 Other intervertebral disc displacement, lumbar region: Secondary | ICD-10-CM | POA: Diagnosis not present

## 2023-09-10 DIAGNOSIS — M5126 Other intervertebral disc displacement, lumbar region: Secondary | ICD-10-CM | POA: Diagnosis not present

## 2023-09-13 DIAGNOSIS — M5116 Intervertebral disc disorders with radiculopathy, lumbar region: Secondary | ICD-10-CM | POA: Diagnosis not present

## 2023-09-17 DIAGNOSIS — M5126 Other intervertebral disc displacement, lumbar region: Secondary | ICD-10-CM | POA: Diagnosis not present

## 2023-09-18 DIAGNOSIS — F4389 Other reactions to severe stress: Secondary | ICD-10-CM | POA: Diagnosis not present

## 2023-09-20 DIAGNOSIS — M5126 Other intervertebral disc displacement, lumbar region: Secondary | ICD-10-CM | POA: Diagnosis not present

## 2023-09-23 ENCOUNTER — Other Ambulatory Visit: Payer: Self-pay | Admitting: Cardiology

## 2023-10-02 ENCOUNTER — Telehealth: Admitting: Physician Assistant

## 2023-10-02 DIAGNOSIS — J069 Acute upper respiratory infection, unspecified: Secondary | ICD-10-CM

## 2023-10-02 MED ORDER — BENZONATATE 100 MG PO CAPS: 100.0000 mg | ORAL_CAPSULE | Freq: Three times a day (TID) | ORAL | 0 refills | Status: AC

## 2023-10-02 MED ORDER — AZITHROMYCIN 250 MG PO TABS: ORAL_TABLET | ORAL | 0 refills | Status: AC

## 2023-10-02 NOTE — Progress Notes (Signed)
 Mr. Allen, Burns are scheduled for a virtual visit with your provider today.    Just as we do with appointments in the office, we must obtain your consent to participate.  Your consent will be active for this visit and any virtual visit you may have with one of our providers in the next 365 days.    If you have a MyChart account, I can also send a copy of this consent to you electronically.  All virtual visits are billed to your insurance company just like a traditional visit in the office.  As this is a virtual visit, video technology does not allow for your provider to perform a traditional examination.  This may limit your provider's ability to fully assess your condition.  If your provider identifies any concerns that need to be evaluated in person or the need to arrange testing such as labs, EKG, etc, we will make arrangements to do so.    Although advances in technology are sophisticated, we cannot ensure that it will always work on either your end or our end.  If the connection with a video visit is poor, we may have to switch to a telephone visit.  With either a video or telephone visit, we are not always able to ensure that we have a secure connection.   I need to obtain your verbal consent now.   Are you willing to proceed with your visit today?   Allen Burns has provided verbal consent on 10/02/2023 for a virtual visit (video or telephone).   Aminta Kales, PA-C 10/02/2023  10:28 AM   Date:  10/02/2023   ID:  Allen Burns, DOB 1961/01/21, MRN [id number]  Patient Location: Home Provider Location: Home Office   Participants: Patient and Provider for Visit and Wrap up  Method of visit: Video  Location of Patient: Home Location of Provider: Home Office Consent was obtain for visit over the video. Services rendered by provider: Visit was performed via video  A video enabled telemedicine application was used and I verified that I am speaking with the correct person using two  identifiers.  PCP:  Allen Draft, MD   Chief Complaint:  Allen Burns  History of Present Illness:    Allen Burns is a 63 y.o. male with history as stated below. Presents video telehealth for an acute care visit  Pt reports lingering cold. States that about 10 days ago sxs started. He reports coughing that is productive, chest congestion  Reports mild rhinorrhea, fevers, chest pain, shortness of breath. States that wife is now ill with similar symptoms.   Past Medical, Surgical, Social History, Allergies, and Medications have been Reviewed.  Past Medical History:  Diagnosis Date   Asthma    Erectile dysfunction    Hyperlipidemia     No outpatient medications have been marked as taking for the 10/02/23 encounter (Video Visit) with [facility] PROVIDER.     Allergies:   Patient has no known allergies.   ROS See HPI for history of present illness.  Physical Exam Constitutional:      Appearance: Normal appearance.   Neurological:     Mental Status: He is alert.               MDM: Discussed that his is likely viral and he can continue with supportive management. He is concerned as he is going out of town. Rx for zpack sent. Advised on watching/waiting to take this med if he develops fevers or worsening symptoms. Otherwise can continue  with supportive care   Tests Ordered: No orders of the defined types were placed in this encounter.   Medication Changes: No orders of the defined types were placed in this encounter.    Disposition:  Follow up  Signed, Aminta Kales, PA-C  10/02/2023 10:28 AM

## 2023-10-02 NOTE — Patient Instructions (Signed)
  Allen Burns, thank you for joining Aminta Kales, PA-C for today's virtual visit.  While this provider is not your primary care provider (PCP), if your PCP is located in our provider database this encounter information will be shared with them immediately following your visit.   A [facility] MyChart account gives you access to today's visit and all your visits, tests, and labs performed at [facility]  click here if you don't have a [facility] MyChart account or go to [email or web address]  Consent: (Patient) Allen Burns provided verbal consent for this virtual visit at the beginning of the encounter.  Current Medications:  Current Outpatient Medications:    aspirin  EC 81 MG tablet, Take 81 mg by mouth daily. Swallow whole., Disp: , Rfl:    atorvastatin  (LIPITOR) 80 MG tablet, TAKE 1 TABLET BY MOUTH DAILY, Disp: 90 tablet, Rfl: 2   Multiple Vitamins-Minerals (MULTI FOR HIM 50+) TABS, 1 tablet, Disp: , Rfl:    nitroGLYCERIN  (NITROSTAT ) 0.4 MG SL tablet, PLACE 1 TABLET (0.4 MG TOTAL) UNDER THE TONGUE EVERY FIVE MINUTES AS NEEDED FOR CHEST PAIN., Disp: 25 tablet, Rfl: 2   Omega-3 Fatty Acids (FISH OIL PO), Take 2 capsules by mouth daily., Disp: , Rfl:    Roflumilast (ZORYVE) 0.3 % CREA, Apply 0.3 % topically in the morning and at bedtime. Apply to the affected area two times daily for 2 weeks, Disp: , Rfl:    triamcinolone ointment (KENALOG) 0.5 %, Apply 1 Application topically 2 (two) times daily. Apply to the affected area two times daily for 2 weeks, Disp: , Rfl:    Medications ordered in this encounter:  No orders of the defined types were placed in this encounter.    *If you need refills on other medications prior to your next appointment, please contact your pharmacy*  Follow-Up: Call back or seek an in-person evaluation if the symptoms worsen or if the condition fails to improve as anticipated.  [facility] [phone]  Other Instructions Take  tessalon as prescribed   You were given a prescription for antibiotics. It is reasonable to monitor symptoms before taking this medication as they will likely resolve on their own. If you develop fever or worsening symptoms take the antibiotics and get an in person appointment   Follow up with your regular doctor in 1 week for reassessment and seek care sooner if your symptoms worsen or fail to improve.  If you have been instructed to have an in-person evaluation today at a local Urgent Care facility, please use the link below. It will take you to a list of all of our available [facility], including address, phone number and hours of operation. Please do not delay care.  [facility]  If you or a family member do not have a primary care provider, use the link below to schedule a visit and establish care. When you choose a [facility] primary care physician or advanced practice provider, you gain a long-term partner in health. Find a Primary Care Provider  Learn more about [facility]'s in-office and virtual care options: [facility] - Get Care Now

## 2023-10-10 DIAGNOSIS — F4322 Adjustment disorder with anxiety: Secondary | ICD-10-CM | POA: Diagnosis not present

## 2023-10-28 DIAGNOSIS — M545 Low back pain, unspecified: Secondary | ICD-10-CM | POA: Diagnosis not present

## 2023-10-30 DIAGNOSIS — F4389 Other reactions to severe stress: Secondary | ICD-10-CM | POA: Diagnosis not present

## 2023-11-08 DIAGNOSIS — M5416 Radiculopathy, lumbar region: Secondary | ICD-10-CM | POA: Diagnosis not present

## 2023-11-27 DIAGNOSIS — M5416 Radiculopathy, lumbar region: Secondary | ICD-10-CM | POA: Diagnosis not present

## 2023-11-29 DIAGNOSIS — H43393 Other vitreous opacities, bilateral: Secondary | ICD-10-CM | POA: Diagnosis not present

## 2023-12-02 DIAGNOSIS — M47896 Other spondylosis, lumbar region: Secondary | ICD-10-CM | POA: Diagnosis not present

## 2023-12-11 DIAGNOSIS — M47896 Other spondylosis, lumbar region: Secondary | ICD-10-CM | POA: Diagnosis not present

## 2023-12-18 DIAGNOSIS — M47896 Other spondylosis, lumbar region: Secondary | ICD-10-CM | POA: Diagnosis not present

## 2023-12-25 DIAGNOSIS — M47896 Other spondylosis, lumbar region: Secondary | ICD-10-CM | POA: Diagnosis not present

## 2024-01-03 DIAGNOSIS — M5416 Radiculopathy, lumbar region: Secondary | ICD-10-CM | POA: Diagnosis not present

## 2024-01-15 DIAGNOSIS — F4322 Adjustment disorder with anxiety: Secondary | ICD-10-CM | POA: Diagnosis not present

## 2024-03-23 ENCOUNTER — Other Ambulatory Visit: Payer: Self-pay

## 2024-03-23 ENCOUNTER — Ambulatory Visit

## 2024-03-23 ENCOUNTER — Ambulatory Visit
Admission: RE | Admit: 2024-03-23 | Discharge: 2024-03-23 | Disposition: A | Discharge status: Home / Self Care | Attending: Family Medicine

## 2024-03-23 VITALS — BP 113/74 | HR 60 | Temp 97.7°F | Resp 17 | Ht 69.0 in | Wt 190.0 lb

## 2024-03-23 DIAGNOSIS — H00014 Hordeolum externum left upper eyelid: Secondary | ICD-10-CM | POA: Diagnosis not present

## 2024-03-23 NOTE — ED Triage Notes (Signed)
 Patient has had a suspicion of left eye issues.  Today, left eye pain, tearing, burning .  Patient has not used any drops in his eyes.  Patient does not wear contacts

## 2024-03-23 NOTE — ED Provider Notes (Signed)
 [facility]    CSN: [id number] Arrival date & time: 03/23/24  1520      History   Chief Complaint Chief Complaint  Patient presents with   Appointment    3:30   Eye Problem    HPI Allen Burns is a 63 y.o. male.  has a past medical history of Asthma, Erectile dysfunction, and Hyperlipidemia.   HPI  Discussed the use of AI scribe software for clinical note transcription with the patient, who gave verbal consent to proceed.   The patient presents with left upper eyelid pain and tearing.  They have experienced pain and tearing in the left eye, which began today. The eyelid was red this morning, and there has been some blurriness while wearing glasses and watching the snow outside. The sensation is described as burning when the eye is kept open, similar to having something in it.  No vision changes, discharge, or matting of the eye upon waking. They do not use contact lenses and have not experienced itching or a sensation of a foreign body in the eye. A warm compress was applied this morning, which did not alleviate the symptoms.  They have not experienced any severe pain or significant vision changes that would prompt further concern.  Past Medical History:  Diagnosis Date   Asthma    Erectile dysfunction    Hyperlipidemia     Patient Active Problem List   Diagnosis Date Noted   Psoriasis 06/19/2023   Screening-pulmonary TB 12/14/2022   Herpesviral infection, unspecified 02/24/2021   Gilbert's syndrome 02/24/2021   Gallstones 12/05/2020   Elevated liver enzymes 12/05/2020   Atherosclerosis of abdominal aorta 11/18/2020   Coronary artery disease 11/18/2020   Asthma 11/18/2020   Unstable angina (HCC)    Hyperlipidemia     Past Surgical History:  Procedure Laterality Date   CORONARY STENT INTERVENTION N/A 02/23/2020   Procedure: CORONARY STENT INTERVENTION;  Surgeon: Jordan, Peter M, MD;  Location: [facility] INVASIVE CV LAB;  Service: Cardiovascular;   Laterality: N/A;   CORONARY ULTRASOUND/IVUS N/A 02/23/2020   Procedure: Intravascular Ultrasound/IVUS;  Surgeon: Jordan, Peter M, MD;  Location: [facility]VASIVE CV LAB;  Service: Cardiovascular;  Laterality: N/A;   LEFT HEART CATH AND CORONARY ANGIOGRAPHY N/A 02/23/2020   Procedure: LEFT HEART CATH AND CORONARY ANGIOGRAPHY;  Surgeon: Jordan, Peter M, MD;  Location: [facility] INVASIVE CV LAB;  Service: Cardiovascular;  Laterality: N/A;   UMBILICAL HERNIA REPAIR  1975       Home Medications    Prior to Admission medications   Medication Sig Start Date End Date Taking? Authorizing Provider  aspirin  EC 81 MG tablet Take 81 mg by mouth daily. Swallow whole.    [provider]  atorvastatin  (LIPITOR) 80 MG tablet TAKE 1 TABLET BY MOUTH DAILY 09/24/23   Lonni Slain, MD  Multiple Vitamins-Minerals (MULTI FOR HIM 50+) TABS 1 tablet    [provider]  nitroGLYCERIN  (NITROSTAT ) 0.4 MG SL tablet PLACE 1 TABLET (0.4 MG TOTAL) UNDER THE TONGUE EVERY FIVE MINUTES AS NEEDED FOR CHEST PAIN. 06/12/23 06/11/24  Lonni Slain, MD  Omega-3 Fatty Acids (FISH OIL PO) Take 2 capsules by mouth daily.    [provider]  Roflumilast (ZORYVE) 0.3 % CREA Apply 0.3 % topically in the morning and at bedtime. Apply to the affected area two times daily for 2 weeks    [provider]  triamcinolone ointment (KENALOG) 0.5 % Apply 1 Application topically 2 (two) times daily. Apply to the affected  area two times daily for 2 weeks    [provider]    Family History Family History  Problem Relation Age of Onset   CAD Mother        3V CABG in his 17s   Cancer Mother    Lung cancer Father    Breast cancer Father    Heart disease Father    Colon cancer Sister        died age 16   Cancer Sister     Social History Social History   Tobacco Use   Smoking status: Former    Current packs/day: 0.00    Average packs/day: 1 pack/day for 27.0 years (27.0 ttl pk-yrs)     Types: Cigarettes    Start date: 09/06/1966    Quit date: 09/05/1993    Years since quitting: 30.5   Smokeless tobacco: Never  Vaping Use   Vaping status: Never Used  Substance Use Topics   Alcohol use: Not Currently    Comment: 27 years ago   Drug use: Not Currently    Types: Marijuana    Comment: 27 years ago     Allergies   Patient has no known allergies.   Review of Systems Review of Systems  Eyes:  Positive for pain and redness. Negative for photophobia, discharge, itching and visual disturbance.     Physical Exam Triage Vital Signs ED Triage Vitals  Encounter Vitals Group     BP 03/23/24 1532 113/74     Girls Systolic BP Percentile --      Girls Diastolic BP Percentile --      Boys Systolic BP Percentile --      Boys Diastolic BP Percentile --      Pulse Rate 03/23/24 1532 60     Resp 03/23/24 1532 17     Temp 03/23/24 1532 97.7 F (36.5 C)     Temp Source 03/23/24 1532 Oral     SpO2 03/23/24 1532 97 %     Weight 03/23/24 1532 190 lb (86.2 kg)     Height 03/23/24 1532 5' 9 (1.753 m)     Head Circumference --      Peak Flow --      Pain Score 03/23/24 1553 1     Pain Loc --      Pain Education --      Exclude from Growth Chart --    No data found.  Updated Vital Signs BP 113/74 (BP Location: Right Arm)   Pulse 60   Temp 97.7 F (36.5 C) (Oral)   Resp 17   Ht 5' 9 (1.753 m)   Wt 190 lb (86.2 kg)   SpO2 97%   BMI 28.06 kg/m   Visual Acuity Right Eye Distance:   Left Eye Distance:   Bilateral Distance:    Right Eye Near:   Left Eye Near:    Bilateral Near:     Physical Exam Vitals reviewed.  Constitutional:      General: He is awake. He is not in acute distress.    Appearance: Normal appearance. He is well-developed and well-groomed. He is not ill-appearing, toxic-appearing or diaphoretic.  HENT:     Head: Normocephalic and atraumatic.  Eyes:     General: Lids are normal. Gaze aligned appropriately.        Right eye: No foreign body,  discharge or hordeolum.        Left eye: Hordeolum present.No foreign body or discharge.  Extraocular Movements: Extraocular movements intact.     Conjunctiva/sclera: Conjunctivae normal.  Pulmonary:     Effort: Pulmonary effort is normal.  Musculoskeletal:     Cervical back: Normal range of motion.  Neurological:     Mental Status: He is alert and oriented to person, place, and time.  Psychiatric:        Attention and Perception: Attention normal.        Mood and Affect: Mood normal.        Speech: Speech normal.        Behavior: Behavior normal. Behavior is cooperative.        Thought Content: Thought content normal.        Judgment: Judgment normal.      UC Treatments / Results  Labs (all labs ordered are listed, but only abnormal results are displayed) Labs Reviewed - No data to display  EKG   Radiology No results found.  Procedures Procedures (including critical care time)  Medications Ordered in UC Medications - No data to display  Initial Impression / Assessment and Plan / UC Course  I have reviewed the triage vital signs and the nursing notes.  Pertinent labs & imaging results that were available during my care of the patient were reviewed by me and considered in my medical decision making (see chart for details).      Final Clinical Impressions(s) / UC Diagnoses   Final diagnoses:  Hordeolum externum of left upper eyelid   Hordeolum externum of left upper eyelid Acute hordeolum externum of the left upper eyelid with redness, swelling, and pain. Symptoms began today with mild pain yesterday evening. No discharge or matting. Vision changes noted but no significant irritation of the sclera. Likely due to clogged Meibomian glands. - Apply warm compresses to the affected eyelid for 10-15 minutes several times a day. - Monitor for resolution within a week. If symptoms persist beyond four weeks, increase in size, or cause severe pain, refer to an  ophthalmologist for possible lancing. - Consider lid wash with over-the-counter products or diluted baby soap if styes become frequent.    Discharge Instructions      VISIT SUMMARY:  You came in today with pain and tearing in your left upper eyelid that started today. Your eyelid was red this morning, and you experienced some blurriness while wearing glasses and watching the snow outside. You described a burning sensation when keeping your eye open, similar to having something in it. There were no vision changes, discharge, or matting of the eye upon waking. You do not use contact lenses and have not experienced itching or a sensation of a foreign body in the eye. A warm compress applied this morning did not alleviate the symptoms.  YOUR PLAN:  -HORDEOLUM EXTERNUM OF LEFT UPPER EYELID: You have an acute hordeolum externum, commonly known as a stye, on your left upper eyelid. This is a red, swollen, and painful bump caused by a clogged oil gland. To help alleviate the symptoms, apply warm compresses to the affected eyelid for 10-15 minutes several times a day. Monitor the condition for improvement within a week. If the symptoms persist beyond four weeks, increase in size, or cause severe pain, you should see an eye specialist for possible further treatment. If styes become frequent, consider using an over-the-counter lid wash or diluted baby soap to clean your eyelids.  INSTRUCTIONS:  Apply warm compresses to the affected eyelid for 10-15 minutes several times a day. Monitor for resolution within a  week. If symptoms persist beyond four weeks, increase in size, or cause severe pain, refer to an ophthalmologist for possible lancing. Consider lid wash with over-the-counter products or diluted baby soap if styes become frequent.     ED Prescriptions   None    PDMP not reviewed this encounter.   Marylene Rocky BRAVO, PA-C 03/23/24 1736

## 2024-03-23 NOTE — Discharge Instructions (Addendum)
 VISIT SUMMARY:  You came in today with pain and tearing in your left upper eyelid that started today. Your eyelid was red this morning, and you experienced some blurriness while wearing glasses and watching the snow outside. You described a burning sensation when keeping your eye open, similar to having something in it. There were no vision changes, discharge, or matting of the eye upon waking. You do not use contact lenses and have not experienced itching or a sensation of a foreign body in the eye. A warm compress applied this morning did not alleviate the symptoms.  YOUR PLAN:  -HORDEOLUM EXTERNUM OF LEFT UPPER EYELID: You have an acute hordeolum externum, commonly known as a stye, on your left upper eyelid. This is a red, swollen, and painful bump caused by a clogged oil gland. To help alleviate the symptoms, apply warm compresses to the affected eyelid for 10-15 minutes several times a day. Monitor the condition for improvement within a week. If the symptoms persist beyond four weeks, increase in size, or cause severe pain, you should see an eye specialist for possible further treatment. If styes become frequent, consider using an over-the-counter lid wash or diluted baby soap to clean your eyelids.  INSTRUCTIONS:  Apply warm compresses to the affected eyelid for 10-15 minutes several times a day. Monitor for resolution within a week. If symptoms persist beyond four weeks, increase in size, or cause severe pain, refer to an ophthalmologist for possible lancing. Consider lid wash with over-the-counter products or diluted baby soap if styes become frequent.

## 2024-04-08 DIAGNOSIS — L400 Psoriasis vulgaris: Secondary | ICD-10-CM | POA: Diagnosis not present

## 2024-06-17 ENCOUNTER — Encounter: Admitting: Family Medicine
